# Patient Record
Sex: Male | Born: 1970
Health system: Southern US, Community
[De-identification: ages and names within clinical notes are randomized; demographics above are authoritative.]

## PROBLEM LIST (undated history)

## (undated) DIAGNOSIS — N419 Inflammatory disease of prostate, unspecified: Secondary | ICD-10-CM

## (undated) DIAGNOSIS — E119 Type 2 diabetes mellitus without complications: Secondary | ICD-10-CM

## (undated) DIAGNOSIS — I10 Essential (primary) hypertension: Secondary | ICD-10-CM

## (undated) DIAGNOSIS — G4730 Sleep apnea, unspecified: Secondary | ICD-10-CM

## (undated) HISTORY — PX: COLONOSCOPY: SHX174

## (undated) HISTORY — DX: Essential (primary) hypertension: I10

## (undated) HISTORY — DX: Sleep apnea, unspecified: G47.30

## (undated) HISTORY — DX: Inflammatory disease of prostate, unspecified: N41.9

---

## 2001-12-15 LAB — HM COLONOSCOPY: HM Colonoscopy: NORMAL

## 2005-10-08 ENCOUNTER — Other Ambulatory Visit: Payer: Self-pay

## 2005-10-08 ENCOUNTER — Emergency Department: Payer: Self-pay | Admitting: Emergency Medicine

## 2007-06-09 ENCOUNTER — Other Ambulatory Visit: Payer: Self-pay

## 2007-06-09 ENCOUNTER — Emergency Department: Payer: Self-pay | Admitting: Emergency Medicine

## 2007-06-23 ENCOUNTER — Ambulatory Visit: Payer: Self-pay | Admitting: Internal Medicine

## 2008-07-18 ENCOUNTER — Emergency Department: Payer: Self-pay | Admitting: Emergency Medicine

## 2008-07-18 ENCOUNTER — Other Ambulatory Visit: Payer: Self-pay

## 2009-01-09 ENCOUNTER — Ambulatory Visit: Payer: Self-pay | Admitting: Internal Medicine

## 2010-07-03 ENCOUNTER — Emergency Department: Payer: Self-pay | Admitting: Internal Medicine

## 2010-07-14 ENCOUNTER — Emergency Department: Payer: Self-pay | Admitting: Emergency Medicine

## 2010-10-04 ENCOUNTER — Ambulatory Visit: Payer: Self-pay

## 2012-12-15 ENCOUNTER — Encounter: Payer: Self-pay | Admitting: Internal Medicine

## 2012-12-15 ENCOUNTER — Ambulatory Visit (INDEPENDENT_AMBULATORY_CARE_PROVIDER_SITE_OTHER): Payer: 59 | Admitting: Internal Medicine

## 2012-12-15 VITALS — BP 140/88 | HR 80 | Temp 98.2°F | Ht 74.0 in | Wt 325.0 lb

## 2012-12-15 DIAGNOSIS — Z8249 Family history of ischemic heart disease and other diseases of the circulatory system: Secondary | ICD-10-CM | POA: Insufficient documentation

## 2012-12-15 DIAGNOSIS — N529 Male erectile dysfunction, unspecified: Secondary | ICD-10-CM

## 2012-12-15 DIAGNOSIS — E785 Hyperlipidemia, unspecified: Secondary | ICD-10-CM

## 2012-12-15 DIAGNOSIS — Z823 Family history of stroke: Secondary | ICD-10-CM

## 2012-12-15 DIAGNOSIS — I10 Essential (primary) hypertension: Secondary | ICD-10-CM

## 2012-12-15 DIAGNOSIS — D649 Anemia, unspecified: Secondary | ICD-10-CM

## 2012-12-15 LAB — COMPREHENSIVE METABOLIC PANEL
ALT: 51 U/L (ref 0–53)
AST: 34 U/L (ref 0–37)
Albumin: 3.9 g/dL (ref 3.5–5.2)
Alkaline Phosphatase: 58 U/L (ref 39–117)
BUN: 16 mg/dL (ref 6–23)
CO2: 25 mEq/L (ref 19–32)
Calcium: 9.4 mg/dL (ref 8.4–10.5)
Chloride: 104 mEq/L (ref 96–112)
Creatinine, Ser: 1.2 mg/dL (ref 0.4–1.5)
GFR: 88.72 mL/min (ref >60.00)
Glucose, Bld: 99 mg/dL (ref 70–99)
Potassium: 4 mEq/L (ref 3.5–5.1)
Sodium: 140 mEq/L (ref 135–145)
Total Bilirubin: 0.7 mg/dL (ref 0.3–1.2)
Total Protein: 7.5 g/dL (ref 6.0–8.3)

## 2012-12-15 LAB — CBC WITH DIFFERENTIAL/PLATELET
Basophils Absolute: 0 10*3/uL (ref 0.0–0.1)
Basophils Relative: 0.6 % (ref 0.0–3.0)
Eosinophils Absolute: 0.2 10*3/uL (ref 0.0–0.7)
Eosinophils Relative: 2.5 % (ref 0.0–5.0)
HCT: 45.9 % (ref 39.0–52.0)
Hemoglobin: 15.5 g/dL (ref 13.0–17.0)
Lymphocytes Relative: 33.2 % (ref 12.0–46.0)
Lymphs Abs: 2.7 10*3/uL (ref 0.7–4.0)
MCHC: 33.8 g/dL (ref 30.0–36.0)
MCV: 90.6 fl (ref 78.0–100.0)
Monocytes Absolute: 0.8 10*3/uL (ref 0.1–1.0)
Monocytes Relative: 9.9 % (ref 3.0–12.0)
Neutro Abs: 4.4 10*3/uL (ref 1.4–7.7)
Neutrophils Relative %: 53.8 % (ref 43.0–77.0)
Platelets: 273 10*3/uL (ref 150.0–400.0)
RBC: 5.07 Mil/uL (ref 4.22–5.81)
RDW: 13.3 % (ref 11.5–14.6)
WBC: 8.2 10*3/uL (ref 4.5–10.5)

## 2012-12-15 LAB — LIPID PANEL
Cholesterol: 153 mg/dL (ref 0–200)
HDL: 33.4 mg/dL — ABNORMAL LOW (ref >39.00)
LDL Cholesterol: 91 mg/dL (ref 0–99)
Total CHOL/HDL Ratio: 5
Triglycerides: 144 mg/dL (ref 0.0–149.0)
VLDL: 28.8 mg/dL (ref 0.0–40.0)

## 2012-12-15 LAB — MICROALBUMIN / CREATININE URINE RATIO
Creatinine,U: 234.8 mg/dL
Microalb Creat Ratio: 0.9 mg/g (ref 0.0–30.0)
Microalb, Ur: 2.1 mg/dL — ABNORMAL HIGH (ref 0.0–1.9)

## 2012-12-15 MED ORDER — DILTIAZEM HCL ER COATED BEADS 180 MG PO CP24: 180.0000 mg | ORAL_CAPSULE | Freq: Every day | ORAL | Status: DC

## 2012-12-15 MED ORDER — TADALAFIL 5 MG PO TABS: 5.0000 mg | ORAL_TABLET | Freq: Every day | ORAL | Status: DC | PRN

## 2012-12-15 MED ORDER — LISINOPRIL 30 MG PO TABS: 30.0000 mg | ORAL_TABLET | Freq: Every day | ORAL | Status: DC

## 2012-12-15 NOTE — Assessment & Plan Note (Signed)
Occasional symptoms of ED. Will try using Cialis to see if any improvement.

## 2012-12-15 NOTE — Assessment & Plan Note (Signed)
Mother had cerebral aneurysm. Will try to get additional records on this.

## 2012-12-15 NOTE — Assessment & Plan Note (Addendum)
BP elevated today and pt reports it has been consistently >140/90. Will increase dose of Lisinopril to 30mg  daily. Will continue Diltiazem. Will request previous records on management. Will check renal function and urine microalbumin today. Follow up 1 month for recheck.

## 2012-12-15 NOTE — Progress Notes (Signed)
Subjective:    Patient ID: Eric Stevens, male    DOB: [birth year], 42 y.o.   MRN: [id number]  HPI 42 year old male with history of hypertension presents to establish care. He reports he is generally feeling well. He has been on current blood pressure medications for several years. He denies any chest pain, palpitations, headache. He reports that his blood pressure typically runs greater than 140/90. He notes a family history of cerebral aneurysm in his mother. He does not have further details on this.  His only concern today is intermittent episodes of difficulty with erectile function. He has never taken medication for this. He denies any decrease in libido. He notes he was evaluated by urologist in the past for prostatitis and evaluation was normal. During that episode, he was treated with antibiotics with resolution.  Outpatient Encounter Prescriptions as of 12/15/2012  Medication Sig Dispense Refill  . diltiazem (CARDIZEM CD) 180 MG 24 hr capsule Take 1 capsule (180 mg total) by mouth daily.  90 capsule  4  . lisinopril (PRINIVIL,ZESTRIL) 30 MG tablet Take 1 tablet (30 mg total) by mouth daily.  90 tablet  4  . Multiple Vitamin (MULTIVITAMIN) tablet Take 1 tablet by mouth daily.      . tadalafil (CIALIS) 5 MG tablet Take 1 tablet (5 mg total) by mouth daily as needed for erectile dysfunction.  10 tablet  0   BP 140/88  Pulse 80  Temp 98.2 F (36.8 C) (Oral)  Ht 6\' 2"  (1.88 m)  Wt 325 lb (147.419 kg)  BMI 41.73 kg/m2  SpO2 98%  Review of Systems  Constitutional: Negative for fever, chills, activity change, appetite change, fatigue and unexpected weight change.  Eyes: Negative for visual disturbance.  Respiratory: Negative for cough and shortness of breath.   Cardiovascular: Negative for chest pain, palpitations and leg swelling.  Gastrointestinal: Negative for abdominal pain and abdominal distention.  Genitourinary: Negative for dysuria, urgency and difficulty urinating.    Musculoskeletal: Negative for arthralgias and gait problem.  Skin: Negative for color change and rash.  Hematological: Negative for adenopathy.  Psychiatric/Behavioral: Negative for sleep disturbance and dysphoric mood. The patient is not nervous/anxious.        Objective:   Physical Exam  Constitutional: He is oriented to person, place, and time. He appears well-developed and well-nourished. No distress.  HENT:  Head: Normocephalic and atraumatic.  Right Ear: External ear normal.  Left Ear: External ear normal.  Nose: Nose normal.  Mouth/Throat: Oropharynx is clear and moist. No oropharyngeal exudate.  Eyes: Conjunctivae normal and EOM are normal. Pupils are equal, round, and reactive to light. Right eye exhibits no discharge. Left eye exhibits no discharge. No scleral icterus.  Neck: Normal range of motion. Neck supple. No tracheal deviation present. No thyromegaly present.  Cardiovascular: Normal rate, regular rhythm and normal heart sounds.  Exam reveals no gallop and no friction rub.   No murmur heard. Pulmonary/Chest: Effort normal and breath sounds normal. No respiratory distress. He has no wheezes. He has no rales. He exhibits no tenderness.  Abdominal: Soft. Bowel sounds are normal. He exhibits no distension. There is no tenderness. There is no rebound and no guarding.  Musculoskeletal: Normal range of motion. He exhibits no edema.  Lymphadenopathy:    He has no cervical adenopathy.  Neurological: He is alert and oriented to person, place, and time. No cranial nerve deficit. Coordination normal.  Skin: Skin is warm and dry. No rash noted. He is not diaphoretic. No erythema. No  pallor.  Psychiatric: He has a normal mood and affect. His behavior is normal. Judgment and thought content normal.          Assessment & Plan:

## 2013-01-14 ENCOUNTER — Ambulatory Visit: Payer: 59 | Admitting: Internal Medicine

## 2013-01-19 ENCOUNTER — Ambulatory Visit: Payer: 59 | Admitting: Internal Medicine

## 2013-02-05 ENCOUNTER — Encounter: Payer: Self-pay | Admitting: Internal Medicine

## 2013-02-05 ENCOUNTER — Ambulatory Visit (INDEPENDENT_AMBULATORY_CARE_PROVIDER_SITE_OTHER): Payer: 59 | Admitting: Internal Medicine

## 2013-02-05 VITALS — BP 148/98 | HR 98 | Temp 98.1°F | Ht 75.0 in | Wt 326.0 lb

## 2013-02-05 DIAGNOSIS — I10 Essential (primary) hypertension: Secondary | ICD-10-CM

## 2013-02-05 DIAGNOSIS — G4733 Obstructive sleep apnea (adult) (pediatric): Secondary | ICD-10-CM | POA: Insufficient documentation

## 2013-02-05 MED ORDER — DILTIAZEM HCL ER COATED BEADS 180 MG PO CP24: 180.0000 mg | ORAL_CAPSULE | Freq: Every day | ORAL | Status: DC

## 2013-02-05 MED ORDER — LISINOPRIL 30 MG PO TABS: 30.0000 mg | ORAL_TABLET | Freq: Every day | ORAL | Status: DC

## 2013-02-05 NOTE — Assessment & Plan Note (Signed)
Patient reports history of obstructive sleep apnea. Will try to get records on previous sleep study. Will set up evaluation with sleep specialist to start CPAP.

## 2013-02-05 NOTE — Progress Notes (Signed)
Subjective:    Patient ID: Eric Stevens, male    DOB: 02/03/1971, 42 y.o.   MRN: [id number]  HPI 42 year old male with history of sleep apnea, hypertension presents for followup. At his last visit, he was instructed to increase lisinopril to 30 mg daily. Unfortunately he never received this prescription from his mail-order pharmacy. He continues on 20 mg daily. He denies any chest pain, palpitations, headache. He reports blood pressure is generally 150s over 90s. Next  He also notes a history of obstructive sleep apnea. Last sleep study approximately 2 years ago. He has not been wearing CPAP over the last couple of years. He notes some daytime fatigue. He would like to reestablish CPAP usage.  Outpatient Encounter Prescriptions as of 02/05/2013  Medication Sig Dispense Refill  . diltiazem (CARDIZEM CD) 180 MG 24 hr capsule Take 1 capsule (180 mg total) by mouth daily.  30 capsule  4  . lisinopril (PRINIVIL,ZESTRIL) 30 MG tablet Take 1 tablet (30 mg total) by mouth daily.  30 tablet  3  . Multiple Vitamin (MULTIVITAMIN) tablet Take 1 tablet by mouth daily.      . [DISCONTINUED] diltiazem (CARDIZEM CD) 180 MG 24 hr capsule Take 1 capsule (180 mg total) by mouth daily.  90 capsule  4  . [DISCONTINUED] lisinopril (PRINIVIL,ZESTRIL) 30 MG tablet Take 1 tablet (30 mg total) by mouth daily.  90 tablet  4  . tadalafil (CIALIS) 5 MG tablet Take 1 tablet (5 mg total) by mouth daily as needed for erectile dysfunction.  10 tablet  0   No facility-administered encounter medications on file as of 02/05/2013.   BP 148/98  Pulse 98  Temp(Src) 98.1 F (36.7 C) (Oral)  Ht 6\' 3"  (1.905 m)  Wt 326 lb (147.873 kg)  BMI 40.75 kg/m2  SpO2 97%  Review of Systems  Constitutional: Positive for fatigue. Negative for fever, chills, activity change, appetite change and unexpected weight change.  Eyes: Negative for visual disturbance.  Respiratory: Negative for cough and shortness of breath.   Cardiovascular:  Negative for chest pain, palpitations and leg swelling.  Gastrointestinal: Negative for abdominal pain and abdominal distention.  Genitourinary: Negative for dysuria, urgency and difficulty urinating.  Musculoskeletal: Negative for arthralgias and gait problem.  Skin: Negative for color change and rash.  Hematological: Negative for adenopathy.  Psychiatric/Behavioral: Negative for sleep disturbance and dysphoric mood. The patient is not nervous/anxious.        Objective:   Physical Exam  Constitutional: He is oriented to person, place, and time. He appears well-developed and well-nourished. No distress.  HENT:  Head: Normocephalic and atraumatic.  Right Ear: External ear normal.  Left Ear: External ear normal.  Nose: Nose normal.  Mouth/Throat: Oropharynx is clear and moist. No oropharyngeal exudate.  Eyes: Conjunctivae and EOM are normal. Pupils are equal, round, and reactive to light. Right eye exhibits no discharge. Left eye exhibits no discharge. No scleral icterus.  Neck: Normal range of motion. Neck supple. No tracheal deviation present. No thyromegaly present.  Cardiovascular: Normal rate, regular rhythm and normal heart sounds.  Exam reveals no gallop and no friction rub.   No murmur heard. Pulmonary/Chest: Effort normal and breath sounds normal. No respiratory distress. He has no wheezes. He has no rales. He exhibits no tenderness.  Musculoskeletal: Normal range of motion. He exhibits no edema.  Lymphadenopathy:    He has no cervical adenopathy.  Neurological: He is alert and oriented to person, place, and time. No cranial nerve deficit. Coordination  normal.  Skin: Skin is warm and dry. No rash noted. He is not diaphoretic. No erythema. No pallor.  Psychiatric: He has a normal mood and affect. His behavior is normal. Judgment and thought content normal.          Assessment & Plan:

## 2013-02-05 NOTE — Assessment & Plan Note (Signed)
BP Readings from Last 3 Encounters:  02/05/13 148/98  12/15/12 140/88   BP persistently elevated, however pt has not yet received increased dose of Lisinopril from his mail order pharmacy. Will call in supply to local pharmacy. Follow up in 1 month for BP recheck.

## 2013-02-10 ENCOUNTER — Telehealth: Payer: Self-pay | Admitting: Internal Medicine

## 2013-02-10 NOTE — Telephone Encounter (Signed)
Sure. We can put him in next slot

## 2013-02-10 NOTE — Telephone Encounter (Signed)
Fwd to Dr. Walker 

## 2013-02-10 NOTE — Telephone Encounter (Signed)
Pt called to schedule DOT physical.  States he cannot go back to work until this is complete.  Pt on sched 6/4 for CPE.  Please advise if pt can be seen sooner for CPE due to his work situation.

## 2013-02-11 ENCOUNTER — Telehealth: Payer: Self-pay | Admitting: Internal Medicine

## 2013-02-11 NOTE — Telephone Encounter (Signed)
The first available is in May or end of April, he is requesting soon as possible.

## 2013-02-11 NOTE — Telephone Encounter (Signed)
Eric Stevens, I spoke with patient and he has confirmed next Tuesday 4/1 @ 1130, could you please add for me?

## 2013-02-11 NOTE — Telephone Encounter (Signed)
Was able to move pt to 4/22; no sooner 30 minute slots available.  Pt still needing the appt sooner.  Is it possible to add him on sooner?

## 2013-02-11 NOTE — Telephone Encounter (Signed)
Patient has been scheduled

## 2013-02-11 NOTE — Telephone Encounter (Signed)
We can add him at 11:30am next Tuesday as an overbook

## 2013-02-15 ENCOUNTER — Ambulatory Visit (INDEPENDENT_AMBULATORY_CARE_PROVIDER_SITE_OTHER): Payer: 59 | Admitting: Internal Medicine

## 2013-02-15 ENCOUNTER — Telehealth: Payer: Self-pay | Admitting: *Deleted

## 2013-02-15 ENCOUNTER — Encounter: Payer: Self-pay | Admitting: Internal Medicine

## 2013-02-15 VITALS — BP 138/100 | HR 91 | Temp 98.4°F | Wt 326.0 lb

## 2013-02-15 DIAGNOSIS — J302 Other seasonal allergic rhinitis: Secondary | ICD-10-CM | POA: Insufficient documentation

## 2013-02-15 DIAGNOSIS — G4733 Obstructive sleep apnea (adult) (pediatric): Secondary | ICD-10-CM

## 2013-02-15 DIAGNOSIS — I10 Essential (primary) hypertension: Secondary | ICD-10-CM

## 2013-02-15 DIAGNOSIS — J309 Allergic rhinitis, unspecified: Secondary | ICD-10-CM

## 2013-02-15 MED ORDER — LOSARTAN POTASSIUM-HCTZ 50-12.5 MG PO TABS: 1.0000 | ORAL_TABLET | Freq: Every day | ORAL | Status: DC

## 2013-02-15 MED ORDER — HYDROCHLOROTHIAZIDE 25 MG PO TABS: 25.0000 mg | ORAL_TABLET | Freq: Every day | ORAL | Status: DC

## 2013-02-15 MED ORDER — AZELASTINE-FLUTICASONE 137-50 MCG/ACT NA SUSP: 1.0000 | Freq: Two times a day (BID) | NASAL | Status: DC

## 2013-02-15 NOTE — Assessment & Plan Note (Signed)
BMI elevated>30, with hypertension, so some risk of OSA. However, no other symptoms such as daytime somnolence. Pt had sleep study in the past, but unsure of results. Will plan for repeat sleep study to evaluate for sleep apnea next week. Follow up 2 weeks.

## 2013-02-15 NOTE — Assessment & Plan Note (Signed)
Rhinorrhea and sneezing consistent with allergic rhinitis. Will start Claritin prn and also start Dymista bid. Follow up 2 weeks.

## 2013-02-15 NOTE — Progress Notes (Signed)
Subjective:    Patient ID: Eric Stevens, male    DOB: December 25, 1970, 42 y.o.   MRN: [id number]  HPI 42YO male with h/o HTN, obesity presents for follow up. He recently increased dose of lisinopril to 30mg  daily. Since that time, he notes that BP at home have been typically 130/80s. He denies chest pain, dyspnea, but has been having irritating dry cough. He also notes recent increase in clear nasal drainage, sneezing.  He does not have a history of seasonal allergies. He has not been taking any medication for this. He denies fever, chills, sick contacts.  He reports having sleep study in the past to evaluate for sleep apnea, but is unsure the results of this study. He has never been on CPAP. He denies daytime somnolence. He is scheduled to have a sleep study next week for evaluation of OSA. He has been trying to lose weight and has been exercising at a local gym.  Outpatient Encounter Prescriptions as of 02/15/2013  Medication Sig Dispense Refill  . diltiazem (CARDIZEM CD) 180 MG 24 hr capsule Take 1 capsule (180 mg total) by mouth daily.  30 capsule  4  . Multiple Vitamin (MULTIVITAMIN) tablet Take 1 tablet by mouth daily.      . [DISCONTINUED] lisinopril (PRINIVIL,ZESTRIL) 30 MG tablet Take 1 tablet (30 mg total) by mouth daily.  30 tablet  3  . Azelastine-Fluticasone (DYMISTA) 137-50 MCG/ACT SUSP Place 1 spray into the nose 2 (two) times daily.  23 g  3  . losartan-hydrochlorothiazide (HYZAAR) 50-12.5 MG per tablet Take 1 tablet by mouth daily.  90 tablet  3  . tadalafil (CIALIS) 5 MG tablet Take 1 tablet (5 mg total) by mouth daily as needed for erectile dysfunction.  10 tablet  0  . [DISCONTINUED] hydrochlorothiazide (HYDRODIURIL) 25 MG tablet Take 1 tablet (25 mg total) by mouth daily.  90 tablet  3   No facility-administered encounter medications on file as of 02/15/2013.   BP 138/100  Pulse 91  Temp(Src) 98.4 F (36.9 C) (Oral)  Wt 326 lb (147.873 kg)  BMI 40.75 kg/m2  SpO2  98%  Review of Systems  Constitutional: Negative for fever, chills, activity change, appetite change, fatigue and unexpected weight change.  HENT: Positive for congestion, rhinorrhea, sneezing and postnasal drip. Negative for ear pain, sore throat, trouble swallowing, voice change and sinus pressure.   Eyes: Negative for visual disturbance.  Respiratory: Positive for cough. Negative for shortness of breath.   Cardiovascular: Negative for chest pain, palpitations and leg swelling.  Gastrointestinal: Negative for abdominal pain and abdominal distention.  Genitourinary: Negative for dysuria, urgency and difficulty urinating.  Musculoskeletal: Negative for arthralgias and gait problem.  Skin: Negative for color change and rash.  Hematological: Negative for adenopathy.  Psychiatric/Behavioral: Negative for sleep disturbance and dysphoric mood. The patient is not nervous/anxious.        Objective:   Physical Exam  Constitutional: He is oriented to person, place, and time. He appears well-developed and well-nourished. No distress.  HENT:  Head: Normocephalic and atraumatic.  Right Ear: External ear normal.  Left Ear: External ear normal.  Nose: Mucosal edema and rhinorrhea present.  Mouth/Throat: Oropharynx is clear and moist. No oropharyngeal exudate.  Eyes: Conjunctivae and EOM are normal. Pupils are equal, round, and reactive to light. Right eye exhibits no discharge. Left eye exhibits no discharge. No scleral icterus.  Neck: Normal range of motion. Neck supple. No tracheal deviation present. No thyromegaly present.  Cardiovascular: Normal rate, regular  rhythm and normal heart sounds.  Exam reveals no gallop and no friction rub.   No murmur heard. Pulmonary/Chest: Effort normal and breath sounds normal. No accessory muscle usage. Not tachypneic. No respiratory distress. He has no decreased breath sounds. He has no wheezes. He has no rhonchi. He has no rales. He exhibits no tenderness.   Musculoskeletal: Normal range of motion. He exhibits no edema.  Lymphadenopathy:    He has no cervical adenopathy.  Neurological: He is alert and oriented to person, place, and time. No cranial nerve deficit. Coordination normal.  Skin: Skin is warm and dry. No rash noted. He is not diaphoretic. No erythema. No pallor.  Psychiatric: He has a normal mood and affect. His behavior is normal. Judgment and thought content normal.          Assessment & Plan:

## 2013-02-15 NOTE — Patient Instructions (Signed)
Stop Lisinopril.  Start Losartan-HCTZ daily for blood pressure. Continue Diltiazem.  Follow up 2 weeks.

## 2013-02-15 NOTE — Telephone Encounter (Signed)
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 Patient confirmed appointment for today, rescheduled from 4/1 to today at 3:00

## 2013-02-15 NOTE — Assessment & Plan Note (Addendum)
BP Readings from Last 3 Encounters:  02/15/13 138/100  02/05/13 148/98  12/15/12 140/88   BP continues to be elevated. Pt now having dry cough, question if this may be allergy to lisinopril. Will change to Losartan-HCTZ. Will recheck BP in 2 weeks. Will plan to check BMP with labs then. If no improvement in BP at next visit, discussed checking renal US.

## 2013-02-16 ENCOUNTER — Telehealth: Payer: Self-pay | Admitting: *Deleted

## 2013-02-16 ENCOUNTER — Encounter: Payer: 59 | Admitting: Internal Medicine

## 2013-02-16 ENCOUNTER — Ambulatory Visit: Payer: 59 | Admitting: Internal Medicine

## 2013-02-16 NOTE — Telephone Encounter (Signed)
Spoke with patient, he did not remember which medication he was to d/c. Looked over notes, informed him Dr. Dan Humphreys wanted him to stop taking the Lisinopril.

## 2013-02-16 NOTE — Telephone Encounter (Signed)
Information faxed via [employer] 

## 2013-02-16 NOTE — Telephone Encounter (Addendum)
Patient called because he does not remember what medication he is supposed to start and which one he is supposed to stop taking when he was last seen by Dr. Dan Humphreys

## 2013-02-16 NOTE — Telephone Encounter (Signed)
Patient called regarding visit with Dr. Dan Humphreys yesterday and would like the notes faxed to this number [phone]

## 2013-02-18 ENCOUNTER — Ambulatory Visit: Payer: 59 | Admitting: Internal Medicine

## 2013-02-23 ENCOUNTER — Other Ambulatory Visit: Payer: Self-pay | Admitting: *Deleted

## 2013-02-23 ENCOUNTER — Telehealth: Payer: Self-pay | Admitting: Internal Medicine

## 2013-02-23 NOTE — Telephone Encounter (Signed)
Please let pt know that it can take 1-2 weeks for [place] to get results on sleep study. I will call or email him as soon as they are available.

## 2013-02-23 NOTE — Telephone Encounter (Signed)
Caller: Eric Stevens/Patient; Phone: [phone]; Reason for Call: Patient did a sleep study last night and was wanting to know what Dr.  Dan Humphreys thought of the results.  Reviewed chart, but did not see results of sleep study at this time.  Please contact patient when results have been reviewed with further instructions.  Thanks.

## 2013-02-24 NOTE — Telephone Encounter (Signed)
Patient notified as instructed by telephone. 

## 2013-03-03 ENCOUNTER — Telehealth: Payer: Self-pay | Admitting: Internal Medicine

## 2013-03-03 NOTE — Telephone Encounter (Signed)
Pt employer calling stating that the pt had a sleep study done and has to have it read by his PCP to clear him to return to work.

## 2013-03-03 NOTE — Telephone Encounter (Signed)
Talked to pt employer that we had not received the notes and the pt employer said he would fax over the notes. Called pt employer back to confirm that we have recieved the notes, and Dr. Dan Humphreys said that pt can go back to work.

## 2013-03-09 ENCOUNTER — Encounter: Payer: 59 | Admitting: Internal Medicine

## 2013-03-10 ENCOUNTER — Ambulatory Visit (INDEPENDENT_AMBULATORY_CARE_PROVIDER_SITE_OTHER): Payer: 59 | Admitting: Internal Medicine

## 2013-03-10 ENCOUNTER — Encounter: Payer: Self-pay | Admitting: Internal Medicine

## 2013-03-10 VITALS — BP 144/100 | HR 79 | Temp 98.2°F | Wt 330.0 lb

## 2013-03-10 DIAGNOSIS — I10 Essential (primary) hypertension: Secondary | ICD-10-CM

## 2013-03-10 DIAGNOSIS — J320 Chronic maxillary sinusitis: Secondary | ICD-10-CM

## 2013-03-10 MED ORDER — AMOXICILLIN-POT CLAVULANATE 875-125 MG PO TABS: 1.0000 | ORAL_TABLET | Freq: Two times a day (BID) | ORAL | Status: DC

## 2013-03-10 MED ORDER — LOSARTAN POTASSIUM-HCTZ 100-12.5 MG PO TABS: 1.0000 | ORAL_TABLET | Freq: Every day | ORAL | Status: DC

## 2013-03-10 MED ORDER — DILTIAZEM HCL ER COATED BEADS 180 MG PO CP24: 180.0000 mg | ORAL_CAPSULE | Freq: Every day | ORAL | Status: DC

## 2013-03-10 NOTE — Progress Notes (Signed)
Subjective:    Patient ID: Eric Stevens, male    DOB: [birth year], 42 y.o.   MRN: [id number]  HPI 42 year old male presents for followup of elevated blood pressure. He reports he has been compliant with his medication however, blood pressure continues to be typically greater than 150/100. He denies any headache, chest pain, palpitations. He reports the cough resolved after switching from lisinopril to losartan.  He underwent sleep study in the interim since his last visit which was negative for sleep apnea.  He is also concerned about ongoing left-sided nasal congestion. This has been present for several weeks. He describes pain and nasal congestion in his left cheek. He has tried taking over-the-counter antihistamines and using nasal steroid with no improvement. He denies any history of trauma to his face or sinus surgery. He denies any fever, chills, cough, shortness of breath.  Outpatient Encounter Prescriptions as of 03/10/2013  Medication Sig Dispense Refill  . Azelastine-Fluticasone (DYMISTA) 137-50 MCG/ACT SUSP Place 1 spray into the nose 2 (two) times daily.  23 g  3  . diltiazem (CARDIZEM CD) 180 MG 24 hr capsule Take 1 capsule (180 mg total) by mouth daily.  30 capsule  11  . Multiple Vitamin (MULTIVITAMIN) tablet Take 1 tablet by mouth daily.      Marland Kitchen amoxicillin-clavulanate (AUGMENTIN) 875-125 MG per tablet Take 1 tablet by mouth 2 (two) times daily.  20 tablet  0  . losartan-hydrochlorothiazide (HYZAAR) 100-12.5 MG per tablet Take 1 tablet by mouth daily.  30 tablet  11  . tadalafil (CIALIS) 5 MG tablet Take 1 tablet (5 mg total) by mouth daily as needed for erectile dysfunction.  10 tablet  0   No facility-administered encounter medications on file as of 03/10/2013.   BP 144/100  Pulse 79  Temp(Src) 98.2 F (36.8 C) (Oral)  Wt 330 lb (149.687 kg)  BMI 41.25 kg/m2  SpO2 97%  Review of Systems  Constitutional: Negative for fever, chills, activity change, appetite change,  fatigue and unexpected weight change.  HENT: Positive for congestion and sinus pressure. Negative for rhinorrhea, sneezing, neck pain and postnasal drip.   Eyes: Negative for visual disturbance.  Respiratory: Negative for cough and shortness of breath.   Cardiovascular: Negative for chest pain, palpitations and leg swelling.  Gastrointestinal: Negative for abdominal pain and abdominal distention.  Genitourinary: Negative for dysuria, urgency and difficulty urinating.  Musculoskeletal: Negative for arthralgias and gait problem.  Skin: Negative for color change and rash.  Hematological: Negative for adenopathy.  Psychiatric/Behavioral: Negative for sleep disturbance and dysphoric mood. The patient is not nervous/anxious.        Objective:   Physical Exam  Constitutional: He is oriented to person, place, and time. He appears well-developed and well-nourished. No distress.  HENT:  Head: Normocephalic and atraumatic.  Right Ear: External ear normal.  Left Ear: External ear normal.  Nose: Mucosal edema present. Right sinus exhibits no maxillary sinus tenderness. Left sinus exhibits maxillary sinus tenderness.  Mouth/Throat: Oropharynx is clear and moist. No oropharyngeal exudate.  Eyes: Conjunctivae and EOM are normal. Pupils are equal, round, and reactive to light. Right eye exhibits no discharge. Left eye exhibits no discharge. No scleral icterus.  Neck: Normal range of motion. Neck supple. No tracheal deviation present. No thyromegaly present.  Cardiovascular: Normal rate, regular rhythm and normal heart sounds.  Exam reveals no gallop and no friction rub.   No murmur heard. Pulmonary/Chest: Effort normal and breath sounds normal. No accessory muscle usage. Not tachypneic. No  respiratory distress. He has no decreased breath sounds. He has no wheezes. He has no rhonchi. He has no rales. He exhibits no tenderness.  Musculoskeletal: Normal range of motion. He exhibits no edema.   Lymphadenopathy:    He has no cervical adenopathy.  Neurological: He is alert and oriented to person, place, and time. No cranial nerve deficit. Coordination normal.  Skin: Skin is warm and dry. No rash noted. He is not diaphoretic. No erythema. No pallor.  Psychiatric: He has a normal mood and affect. His behavior is normal. Judgment and thought content normal.          Assessment & Plan:

## 2013-03-10 NOTE — Assessment & Plan Note (Signed)
Symptoms are most consistent with left maxillary sinusitis. Will treat with Augmentin x10 days. If no improvement, we discussed referral to ENT for direct visualization of the left maxillary sinus.

## 2013-03-10 NOTE — Assessment & Plan Note (Signed)
BP Readings from Last 3 Encounters:  03/10/13 144/100  02/15/13 138/100  02/05/13 148/98   BP elevated despite increase in medication. Will set up renal artery doppler for further evaluation. Will increase Losartan-HCTZ to 100-12.5. Follow up 4 weeks.

## 2013-03-10 NOTE — Patient Instructions (Signed)
Start higher dose of Losartan-HCTZ 100-12.5mg  daily. Monitor blood presure 1-2 times per week at home.  Email with update Monday.  We will schedule ultrasound of your kidneys.  Follow up 4 weeks.

## 2013-03-19 ENCOUNTER — Ambulatory Visit: Payer: 59 | Admitting: Internal Medicine

## 2013-03-19 ENCOUNTER — Encounter: Payer: Self-pay | Admitting: Internal Medicine

## 2013-03-25 ENCOUNTER — Telehealth: Payer: Self-pay | Admitting: Internal Medicine

## 2013-03-25 NOTE — Telephone Encounter (Signed)
Renal artery ultrasound showed normal left renal artery, however they could not visualize the proximal area. Right renal artery could not be visualized because of bowel gas.

## 2013-03-26 NOTE — Telephone Encounter (Signed)
LMTCB

## 2013-03-26 NOTE — Telephone Encounter (Signed)
Patient informed and verbally agreed.  

## 2013-04-07 ENCOUNTER — Telehealth: Payer: Self-pay | Admitting: *Deleted

## 2013-04-07 NOTE — Telephone Encounter (Signed)
Spoke with patient he stated he was given some medication for his sinus and it helped. But now it is back, his nose is getting stopped at night again. Would like to know if something could be called in again.

## 2013-04-07 NOTE — Telephone Encounter (Signed)
Patient left voicemail stating he was given abx a couple months ago and it did help with the sinus infection but now it seems like it has returned.

## 2013-04-07 NOTE — Telephone Encounter (Signed)
Spoke with patient, he has an appointment next week, this would be only time he would be able to get in.

## 2013-04-07 NOTE — Telephone Encounter (Signed)
Needs visit

## 2013-04-14 ENCOUNTER — Encounter: Payer: Self-pay | Admitting: Internal Medicine

## 2013-04-14 ENCOUNTER — Ambulatory Visit (INDEPENDENT_AMBULATORY_CARE_PROVIDER_SITE_OTHER): Payer: 59 | Admitting: Internal Medicine

## 2013-04-14 ENCOUNTER — Other Ambulatory Visit (INDEPENDENT_AMBULATORY_CARE_PROVIDER_SITE_OTHER): Payer: 59

## 2013-04-14 VITALS — BP 144/102 | HR 76 | Temp 98.6°F | Wt 328.0 lb

## 2013-04-14 DIAGNOSIS — R197 Diarrhea, unspecified: Secondary | ICD-10-CM

## 2013-04-14 DIAGNOSIS — I10 Essential (primary) hypertension: Secondary | ICD-10-CM

## 2013-04-14 MED ORDER — LOSARTAN POTASSIUM-HCTZ 100-12.5 MG PO TABS: 1.0000 | ORAL_TABLET | Freq: Every day | ORAL | Status: DC

## 2013-04-14 NOTE — Assessment & Plan Note (Signed)
BP Readings from Last 3 Encounters:  04/14/13 144/102  03/10/13 144/100  02/15/13 138/100   BP elevated today, however pt has been out of medication (because insurance requires from mail order). Rx sent to mail order today. Will recheck BP next week.

## 2013-04-14 NOTE — Assessment & Plan Note (Signed)
Symptoms of perfuse watery diarrhea over last 3 days. Differential includes viral gastroenteritis versus bacterial enteritis. Pt has some risk of CDiff given recent use of Augmentin. Will send stool culture and testing for CDiff and Ecoli. Pt will increase fluid intake and continue to use Immodium sparingly. Follow up 4 days and prn.

## 2013-04-14 NOTE — Progress Notes (Signed)
Subjective:    Patient ID: Eric Stevens, male    DOB: 1970/11/26, 42 y.o.   MRN: [id number]  HPI 42YO male with HTN presents for follow up. Concerned today about 3 days of watery diarrhea, abdominal cramping.  Had subjective fever and chills on Monday, this has resolved. Approx 5 stools per day, watery, non-bloody. Abdominal cramping prior to BMs. Symptoms improved slightly with Immodium. No sick contacts. No recent travel, camping. Recent exposure to Augmentin for sinus infection.  Has been out of BP meds x 3-4 days because insurance would not refill at local pharmacy. Needs mail order Rx.  Outpatient Encounter Prescriptions as of 04/14/2013  Medication Sig Dispense Refill  . diltiazem (CARDIZEM CD) 180 MG 24 hr capsule Take 1 capsule (180 mg total) by mouth daily.  30 capsule  11  . Loperamide HCl (IMODIUM PO) Take by mouth.      . losartan-hydrochlorothiazide (HYZAAR) 100-12.5 MG per tablet Take 1 tablet by mouth daily.  90 tablet  4  . [DISCONTINUED] losartan-hydrochlorothiazide (HYZAAR) 100-12.5 MG per tablet Take 1 tablet by mouth daily.  30 tablet  11  . Azelastine-Fluticasone (DYMISTA) 137-50 MCG/ACT SUSP Place 1 spray into the nose 2 (two) times daily.  23 g  3  . Multiple Vitamin (MULTIVITAMIN) tablet Take 1 tablet by mouth daily.      . tadalafil (CIALIS) 5 MG tablet Take 1 tablet (5 mg total) by mouth daily as needed for erectile dysfunction.  10 tablet  0  . [DISCONTINUED] amoxicillin-clavulanate (AUGMENTIN) 875-125 MG per tablet Take 1 tablet by mouth 2 (two) times daily.  20 tablet  0   No facility-administered encounter medications on file as of 04/14/2013.   BP 144/102  Pulse 76  Temp(Src) 98.6 F (37 C) (Oral)  Wt 328 lb (148.78 kg)  BMI 41 kg/m2  SpO2 97%  Review of Systems  Constitutional: Positive for fever and chills. Negative for activity change, appetite change, fatigue and unexpected weight change.  Eyes: Negative for visual disturbance.  Respiratory: Negative  for cough and shortness of breath.   Cardiovascular: Negative for chest pain, palpitations and leg swelling.  Gastrointestinal: Positive for abdominal pain (cramping) and diarrhea. Negative for nausea, vomiting, blood in stool, abdominal distention, anal bleeding and rectal pain.  Genitourinary: Negative for dysuria, urgency and difficulty urinating.  Musculoskeletal: Negative for arthralgias and gait problem.  Skin: Negative for color change and rash.  Hematological: Negative for adenopathy.  Psychiatric/Behavioral: Negative for sleep disturbance and dysphoric mood. The patient is not nervous/anxious.        Objective:   Physical Exam  Constitutional: He is oriented to person, place, and time. He appears well-developed and well-nourished. No distress.  HENT:  Head: Normocephalic and atraumatic.  Right Ear: External ear normal.  Left Ear: External ear normal.  Nose: Nose normal.  Mouth/Throat: Oropharynx is clear and moist. No oropharyngeal exudate.  Eyes: Conjunctivae and EOM are normal. Pupils are equal, round, and reactive to light. Right eye exhibits no discharge. Left eye exhibits no discharge. No scleral icterus.  Neck: Normal range of motion. Neck supple. No tracheal deviation present. No thyromegaly present.  Cardiovascular: Normal rate, regular rhythm and normal heart sounds.  Exam reveals no gallop and no friction rub.   No murmur heard. Pulmonary/Chest: Effort normal and breath sounds normal. No respiratory distress. He has no wheezes. He has no rales. He exhibits no tenderness.  Abdominal: Soft. Bowel sounds are normal. He exhibits no distension and no mass. There is  tenderness (diffuse mild). There is no rebound and no guarding.  Musculoskeletal: Normal range of motion. He exhibits no edema.  Lymphadenopathy:    He has no cervical adenopathy.  Neurological: He is alert and oriented to person, place, and time. No cranial nerve deficit. Coordination normal.  Skin: Skin is  warm and dry. No rash noted. He is not diaphoretic. No erythema. No pallor.  Psychiatric: He has a normal mood and affect. His behavior is normal. Judgment and thought content normal.          Assessment & Plan:

## 2013-04-15 LAB — GASTROINTESTINAL PATHOGEN PANEL PCR
C. difficile Tox A/B, PCR: NEGATIVE
Campylobacter, PCR: NEGATIVE
Cryptosporidium, PCR: NEGATIVE
E coli (ETEC) LT/ST PCR: NEGATIVE
E coli (STEC) stx1/stx2, PCR: NEGATIVE
E coli 0157, PCR: NEGATIVE
Giardia lamblia, PCR: NEGATIVE
Norovirus, PCR: NEGATIVE
Rotavirus A, PCR: NEGATIVE
Salmonella, PCR: NEGATIVE
Shigella, PCR: NEGATIVE

## 2013-04-15 LAB — CLOSTRIDIUM DIFFICILE EIA: CDIFTX: NEGATIVE

## 2013-04-15 LAB — OVA AND PARASITE SCREEN: OP: NONE SEEN

## 2013-04-16 ENCOUNTER — Encounter: Payer: Self-pay | Admitting: Internal Medicine

## 2013-04-16 ENCOUNTER — Telehealth: Payer: Self-pay | Admitting: *Deleted

## 2013-04-16 MED ORDER — DIPHENOXYLATE-ATROPINE 2.5-0.025 MG PO TABS: 1.0000 | ORAL_TABLET | Freq: Four times a day (QID) | ORAL | Status: DC | PRN

## 2013-04-16 NOTE — Telephone Encounter (Signed)
He has been according to [employer].

## 2013-04-16 NOTE — Telephone Encounter (Signed)
We will need to call it in. I have printed it. He will need to follow up next week.

## 2013-04-16 NOTE — Telephone Encounter (Signed)
Patient called back and stated he is still having symptoms, he did not eat much of anything yesterday but drank a lot of liquids. He is still having the symptoms though, would like to know if there is anything he can do? Please advise.

## 2013-04-16 NOTE — Telephone Encounter (Signed)
Spoke to patient and he has been taking it, would like it called in to [facility] on [address]

## 2013-04-16 NOTE — Telephone Encounter (Signed)
Message copied by Theola Sequin on Fri Apr 16, 2013 12:43 PM ------      Message from: Ronna Polio A      Created: Fri Apr 16, 2013  7:56 AM       Can you make sure pt received this            Stool testing was negative. Are your symptoms better? ------

## 2013-04-16 NOTE — Telephone Encounter (Signed)
Is he taking the Immodium? If yes, and symptoms are persistent, then we can call in Lomotil.

## 2013-04-18 LAB — STOOL CULTURE

## 2013-04-19 NOTE — Telephone Encounter (Signed)
Rx was called in on Friday of last week and patient is aware

## 2013-04-21 ENCOUNTER — Encounter: Payer: 59 | Admitting: Internal Medicine

## 2013-04-22 ENCOUNTER — Ambulatory Visit: Payer: 59 | Admitting: Internal Medicine

## 2013-04-28 ENCOUNTER — Ambulatory Visit: Payer: 59 | Admitting: Internal Medicine

## 2013-05-31 ENCOUNTER — Other Ambulatory Visit: Payer: Self-pay | Admitting: *Deleted

## 2013-05-31 DIAGNOSIS — I10 Essential (primary) hypertension: Secondary | ICD-10-CM

## 2013-05-31 MED ORDER — DILTIAZEM HCL ER COATED BEADS 180 MG PO CP24: 180.0000 mg | ORAL_CAPSULE | Freq: Every day | ORAL | Status: DC

## 2013-05-31 NOTE — Telephone Encounter (Signed)
Rx sent to pharmacy on file.

## 2013-06-28 ENCOUNTER — Encounter: Payer: Self-pay | Admitting: Adult Health

## 2013-06-28 ENCOUNTER — Ambulatory Visit (INDEPENDENT_AMBULATORY_CARE_PROVIDER_SITE_OTHER): Payer: 59 | Admitting: Adult Health

## 2013-06-28 VITALS — BP 132/84 | HR 72 | Temp 98.2°F | Resp 12 | Wt 331.5 lb

## 2013-06-28 DIAGNOSIS — R21 Rash and other nonspecific skin eruption: Secondary | ICD-10-CM | POA: Insufficient documentation

## 2013-06-28 MED ORDER — TRIAMCINOLONE ACETONIDE 0.1 % EX CREA: TOPICAL_CREAM | Freq: Two times a day (BID) | CUTANEOUS | Status: DC

## 2013-06-28 NOTE — Progress Notes (Signed)
  Subjective:    Patient ID: Eric Stevens, male    DOB: 07/14/1971, 42 y.o.   MRN: [id number]  HPI  Patient presents with rash on forearms that has been ongoing for approximately 1 week. He reports that his son had a rash similar prior to his rash developing. Patient has been scratching which is causing rash to spread. Denies any other symptoms associated with rash.  Current Outpatient Prescriptions on File Prior to Visit  Medication Sig Dispense Refill  . Azelastine-Fluticasone (DYMISTA) 137-50 MCG/ACT SUSP Place 1 spray into the nose 2 (two) times daily.  23 g  3  . diltiazem (CARDIZEM CD) 180 MG 24 hr capsule Take 1 capsule (180 mg total) by mouth daily.  90 capsule  1  . diphenoxylate-atropine (LOMOTIL) 2.5-0.025 MG per tablet Take 1 tablet by mouth 4 (four) times daily as needed for diarrhea or loose stools.  60 tablet  0  . Loperamide HCl (IMODIUM PO) Take by mouth.      . losartan-hydrochlorothiazide (HYZAAR) 100-12.5 MG per tablet Take 1 tablet by mouth daily.  90 tablet  4  . Multiple Vitamin (MULTIVITAMIN) tablet Take 1 tablet by mouth daily.      . tadalafil (CIALIS) 5 MG tablet Take 1 tablet (5 mg total) by mouth daily as needed for erectile dysfunction.  10 tablet  0   No current facility-administered medications on file prior to visit.     Review of Systems  Skin: Positive for rash.       bil forearms       Objective:   Physical Exam  Skin: Rash noted. No erythema.  Flat, rash on bilateral forearms          Assessment & Plan:

## 2013-06-28 NOTE — Assessment & Plan Note (Signed)
Triamcinolone cream to affect area twice a day. If no improvement in 1-2 weeks will refer to derm

## 2013-06-28 NOTE — Patient Instructions (Addendum)
  Apply triamcinolone cream (Kenalog) to the affected area twice a day.  You can take benadryl if needed for the itching.  If this is not improved within 1-2 weeks please let us know

## 2013-07-09 ENCOUNTER — Telehealth: Payer: Self-pay | Admitting: Internal Medicine

## 2013-07-09 ENCOUNTER — Other Ambulatory Visit: Payer: Self-pay | Admitting: Adult Health

## 2013-07-09 MED ORDER — PREDNISONE 10 MG PO TABS: ORAL_TABLET | ORAL | Status: DC

## 2013-07-09 NOTE — Telephone Encounter (Signed)
Pt notified that Prednisone taper was sent to the pharmacy and to call back with failure of improvement.

## 2013-07-09 NOTE — Telephone Encounter (Signed)
Pt is needing something stronger sent into his pharmacy for the rash he has. He was seen recently in the office and it is not getting any beter.

## 2013-09-09 ENCOUNTER — Ambulatory Visit: Payer: 59 | Admitting: Adult Health

## 2013-09-10 ENCOUNTER — Ambulatory Visit (INDEPENDENT_AMBULATORY_CARE_PROVIDER_SITE_OTHER): Payer: 59 | Admitting: Adult Health

## 2013-09-10 ENCOUNTER — Encounter: Payer: Self-pay | Admitting: Adult Health

## 2013-09-10 VITALS — BP 126/98 | HR 95 | Temp 98.2°F | Wt 330.0 lb

## 2013-09-10 DIAGNOSIS — R1013 Epigastric pain: Secondary | ICD-10-CM

## 2013-09-10 LAB — CBC WITH DIFFERENTIAL/PLATELET
Basophils Absolute: 0 10*3/uL (ref 0.0–0.1)
Basophils Relative: 1 % (ref 0–1)
Eosinophils Absolute: 0.2 10*3/uL (ref 0.0–0.7)
Eosinophils Relative: 2 % (ref 0–5)
HCT: 45.4 % (ref 39.0–52.0)
Hemoglobin: 15.8 g/dL (ref 13.0–17.0)
Lymphocytes Relative: 38 % (ref 12–46)
Lymphs Abs: 3.3 10*3/uL (ref 0.7–4.0)
MCH: 31.5 pg (ref 26.0–34.0)
MCHC: 34.8 g/dL (ref 30.0–36.0)
MCV: 90.4 fL (ref 78.0–100.0)
Monocytes Absolute: 0.8 10*3/uL (ref 0.1–1.0)
Monocytes Relative: 9 % (ref 3–12)
Neutro Abs: 4.3 10*3/uL (ref 1.7–7.7)
Neutrophils Relative %: 50 % (ref 43–77)
Platelets: 301 10*3/uL (ref 150–400)
RBC: 5.02 MIL/uL (ref 4.22–5.81)
RDW: 13.6 % (ref 11.5–15.5)
WBC: 8.7 10*3/uL (ref 4.0–10.5)

## 2013-09-10 NOTE — Assessment & Plan Note (Signed)
Patient has stopped drinking alcohol for 2 weeks with some improvement of his symptoms. Suspect gastritis versus ulcer. Increase omeprazole to 20 mg twice a day. Check CBC, comprehensive metabolic panel and H. pylori. Consider referral to GI.

## 2013-09-10 NOTE — Progress Notes (Signed)
  Subjective:    Patient ID: Eric Stevens, male    DOB: 08-02-1971, 42 y.o.   MRN: [id number]  HPI  Pt reports intermittent abd pain for several weeks.  Pt states pain is worse after eating or drinking, particularly when drinking alcohol. Pt states pain is in mid upper and mid lower abd. Pt denies nausea or vomiting. Pt reports more frequent bowel movements but denies loose stools.  Denies blood in stools. Denies burping or burning sensation in throat.  Denies fevers or chills, denies urinary sx. Patient has very vague recollection of symptoms and is a poor historian.   Current Outpatient Prescriptions on File Prior to Visit  Medication Sig Dispense Refill  . diltiazem (CARDIZEM CD) 180 MG 24 hr capsule Take 1 capsule (180 mg total) by mouth daily.  90 capsule  1  . losartan-hydrochlorothiazide (HYZAAR) 100-12.5 MG per tablet Take 1 tablet by mouth daily.  90 tablet  4  . Azelastine-Fluticasone (DYMISTA) 137-50 MCG/ACT SUSP Place 1 spray into the nose 2 (two) times daily.  23 g  3  . diphenoxylate-atropine (LOMOTIL) 2.5-0.025 MG per tablet Take 1 tablet by mouth 4 (four) times daily as needed for diarrhea or loose stools.  60 tablet  0  . Loperamide HCl (IMODIUM PO) Take by mouth.      . Multiple Vitamin (MULTIVITAMIN) tablet Take 1 tablet by mouth daily.      . predniSONE (DELTASONE) 10 MG tablet Take 60 mg (6 tablets on day 1) then decrease by 10 mg (1 tablet) daily until done.  21 tablet  0  . tadalafil (CIALIS) 5 MG tablet Take 1 tablet (5 mg total) by mouth daily as needed for erectile dysfunction.  10 tablet  0  . triamcinolone cream (KENALOG) 0.1 % Apply topically 2 (two) times daily.  30 g  0   No current facility-administered medications on file prior to visit.     Review of Systems  Constitutional: Negative for fever, chills, fatigue and unexpected weight change.  Respiratory: Negative for cough, chest tightness and shortness of breath.   Cardiovascular: Negative for chest pain.   Gastrointestinal: Positive for abdominal pain. Negative for nausea, vomiting, diarrhea, constipation, blood in stool and abdominal distention.  Genitourinary: Negative for dysuria, hematuria and flank pain.  Musculoskeletal: Negative for back pain.  Skin: Negative for color change and rash.  Psychiatric/Behavioral: Negative.        Objective:   Physical Exam  Constitutional: He is oriented to person, place, and time. He appears well-developed and well-nourished.  Abdominal: Soft. Bowel sounds are normal. He exhibits no distension and no mass. There is tenderness.  Epigastric tenderness  Neurological: He is alert and oriented to person, place, and time.  Skin: Skin is warm and dry.  Psychiatric: He has a normal mood and affect. His behavior is normal. Judgment and thought content normal.    BP 126/98  Pulse 95  Temp(Src) 98.2 F (36.8 C) (Oral)  Wt 330 lb (149.687 kg)  BMI 41.25 kg/m2  SpO2 96%       Assessment & Plan:

## 2013-09-10 NOTE — Patient Instructions (Signed)
  Your symptoms may be related to gastritis or even an ulcer.  Please have your labs drawn prior to leaving the office.  Increase your omeprazole (Prilosec) to twice a day for 14 days.

## 2013-09-11 LAB — COMPREHENSIVE METABOLIC PANEL
ALT: 71 U/L — ABNORMAL HIGH (ref 0–53)
AST: 38 U/L — ABNORMAL HIGH (ref 0–37)
Albumin: 4.3 g/dL (ref 3.5–5.2)
Alkaline Phosphatase: 51 U/L (ref 39–117)
BUN: 18 mg/dL (ref 6–23)
CO2: 27 mEq/L (ref 19–32)
Calcium: 9.8 mg/dL (ref 8.4–10.5)
Chloride: 104 mEq/L (ref 96–112)
Creat: 1.11 mg/dL (ref 0.50–1.35)
Glucose, Bld: 65 mg/dL — ABNORMAL LOW (ref 70–99)
Potassium: 4.9 mEq/L (ref 3.5–5.3)
Sodium: 140 mEq/L (ref 135–145)
Total Bilirubin: 0.5 mg/dL (ref 0.3–1.2)
Total Protein: 7.3 g/dL (ref 6.0–8.3)

## 2013-09-13 ENCOUNTER — Other Ambulatory Visit: Payer: Self-pay | Admitting: Adult Health

## 2013-09-13 ENCOUNTER — Telehealth: Payer: Self-pay | Admitting: *Deleted

## 2013-09-13 DIAGNOSIS — R748 Abnormal levels of other serum enzymes: Secondary | ICD-10-CM

## 2013-09-13 LAB — H. PYLORI ANTIBODY, IGG: H Pylori IgG: <0.4 {ISR}

## 2013-09-13 NOTE — Telephone Encounter (Signed)
Spoke with pt. He is aware of apt, not sure if it will work. Will let me know tomorrow.

## 2013-09-13 NOTE — Telephone Encounter (Signed)
Pt returned your call.  

## 2013-09-16 ENCOUNTER — Ambulatory Visit: Payer: Self-pay | Admitting: Adult Health

## 2013-09-21 ENCOUNTER — Other Ambulatory Visit: Payer: Self-pay | Admitting: Adult Health

## 2013-09-21 DIAGNOSIS — R748 Abnormal levels of other serum enzymes: Secondary | ICD-10-CM

## 2013-09-21 NOTE — Progress Notes (Signed)
Ultrasound shows fatty liver. I am referring him to GI

## 2013-09-22 ENCOUNTER — Encounter: Payer: Self-pay | Admitting: Emergency Medicine

## 2013-09-22 ENCOUNTER — Encounter: Payer: Self-pay | Admitting: *Deleted

## 2013-09-22 NOTE — Progress Notes (Signed)
Sent myChart message

## 2013-09-23 ENCOUNTER — Other Ambulatory Visit: Payer: Self-pay

## 2013-09-24 ENCOUNTER — Ambulatory Visit: Payer: 59 | Admitting: Internal Medicine

## 2013-10-04 ENCOUNTER — Encounter: Payer: Self-pay | Admitting: Adult Health

## 2013-10-04 ENCOUNTER — Encounter: Payer: Self-pay | Admitting: Internal Medicine

## 2013-10-04 ENCOUNTER — Ambulatory Visit (INDEPENDENT_AMBULATORY_CARE_PROVIDER_SITE_OTHER): Payer: 59 | Admitting: Internal Medicine

## 2013-10-04 VITALS — BP 130/92 | HR 85 | Temp 98.3°F | Wt 328.0 lb

## 2013-10-04 DIAGNOSIS — I10 Essential (primary) hypertension: Secondary | ICD-10-CM

## 2013-10-04 DIAGNOSIS — R748 Abnormal levels of other serum enzymes: Secondary | ICD-10-CM

## 2013-10-04 DIAGNOSIS — R1013 Epigastric pain: Secondary | ICD-10-CM

## 2013-10-04 LAB — COMPREHENSIVE METABOLIC PANEL
ALT: 52 U/L (ref 0–53)
AST: 32 U/L (ref 0–37)
Albumin: 4.1 g/dL (ref 3.5–5.2)
Alkaline Phosphatase: 49 U/L (ref 39–117)
BUN: 17 mg/dL (ref 6–23)
CO2: 24 mEq/L (ref 19–32)
Calcium: 9.6 mg/dL (ref 8.4–10.5)
Chloride: 104 mEq/L (ref 96–112)
Creatinine, Ser: 1.1 mg/dL (ref 0.4–1.5)
GFR: 97.97 mL/min (ref >60.00)
Glucose, Bld: 107 mg/dL — ABNORMAL HIGH (ref 70–99)
Potassium: 4 mEq/L (ref 3.5–5.1)
Sodium: 138 mEq/L (ref 135–145)
Total Bilirubin: 0.6 mg/dL (ref 0.3–1.2)
Total Protein: 7.5 g/dL (ref 6.0–8.3)

## 2013-10-04 LAB — PROTIME-INR
INR: 1.1 ratio — ABNORMAL HIGH (ref 0.8–1.0)
Prothrombin Time: 11.4 s (ref 10.2–12.4)

## 2013-10-04 LAB — TSH: TSH: 0.89 u[IU]/mL (ref 0.35–5.50)

## 2013-10-04 NOTE — Assessment & Plan Note (Signed)
BP Readings from Last 3 Encounters:  10/04/13 130/92  09/10/13 126/98  06/28/13 132/84   BP generally well controlled on current medications. Encouraged better compliance with healthy, low sodium diet and regular exercise.

## 2013-10-04 NOTE — Progress Notes (Signed)
Subjective:    Patient ID: Eric Stevens, male    DOB: 1971/02/05, 42 y.o.   MRN: [id number]  HPI 42 year old male with history of hypertension presents for followup after recent episode of epigastric pain. At his last visit, he was started on omeprazole. He reports improvement in symptoms of epigastric pain. He continues to take omeprazole. Testing for H. pylori was negative. He reports that he is generally feeling well. We reviewed recent labs today which showed increase in liver function tests. He has never been told in the past that he had elevated liver function tests. He has no known history of hepatitis.  Outpatient Encounter Prescriptions as of 10/04/2013  Medication Sig  . diltiazem (CARDIZEM CD) 180 MG 24 hr capsule Take 1 capsule (180 mg total) by mouth daily.  Marland Kitchen losartan-hydrochlorothiazide (HYZAAR) 100-12.5 MG per tablet Take 1 tablet by mouth daily.  Marland Kitchen omeprazole (PRILOSEC) 20 MG capsule Take 20 mg by mouth daily.  . Azelastine-Fluticasone (DYMISTA) 137-50 MCG/ACT SUSP Place 1 spray into the nose 2 (two) times daily.  . Multiple Vitamin (MULTIVITAMIN) tablet Take 1 tablet by mouth daily.  . tadalafil (CIALIS) 5 MG tablet Take 1 tablet (5 mg total) by mouth daily as needed for erectile dysfunction.  . triamcinolone cream (KENALOG) 0.1 % Apply topically 2 (two) times daily.   BP 130/92  Pulse 85  Temp(Src) 98.3 F (36.8 C) (Oral)  Wt 328 lb (148.78 kg)  SpO2 96%  Review of Systems  Constitutional: Negative for fever, chills, activity change, appetite change, fatigue and unexpected weight change.  Eyes: Negative for visual disturbance.  Respiratory: Negative for cough and shortness of breath.   Cardiovascular: Negative for chest pain, palpitations and leg swelling.  Gastrointestinal: Positive for abdominal pain (intermittent mild epigastric, now improved compared to previous visit). Negative for abdominal distention.  Genitourinary: Negative for dysuria, urgency and  difficulty urinating.  Musculoskeletal: Negative for arthralgias and gait problem.  Skin: Negative for color change and rash.  Hematological: Negative for adenopathy.  Psychiatric/Behavioral: Negative for sleep disturbance and dysphoric mood. The patient is not nervous/anxious.        Objective:   Physical Exam  Constitutional: He is oriented to person, place, and time. He appears well-developed and well-nourished. No distress.  HENT:  Head: Normocephalic and atraumatic.  Right Ear: External ear normal.  Left Ear: External ear normal.  Nose: Nose normal.  Mouth/Throat: Oropharynx is clear and moist. No oropharyngeal exudate.  Eyes: Conjunctivae and EOM are normal. Pupils are equal, round, and reactive to light. Right eye exhibits no discharge. Left eye exhibits no discharge. No scleral icterus.  Neck: Normal range of motion. Neck supple. No tracheal deviation present. No thyromegaly present.  Cardiovascular: Normal rate, regular rhythm and normal heart sounds.  Exam reveals no gallop and no friction rub.   No murmur heard. Pulmonary/Chest: Effort normal and breath sounds normal. No respiratory distress. He has no wheezes. He has no rales. He exhibits no tenderness.  Abdominal: Soft. Bowel sounds are normal. He exhibits no distension and no mass. There is no tenderness. There is no guarding.  Musculoskeletal: Normal range of motion. He exhibits no edema.  Lymphadenopathy:    He has no cervical adenopathy.  Neurological: He is alert and oriented to person, place, and time. No cranial nerve deficit. Coordination normal.  Skin: Skin is warm and dry. No rash noted. He is not diaphoretic. No erythema. No pallor.  Psychiatric: He has a normal mood and affect. His behavior is normal.  Judgment and thought content normal.          Assessment & Plan:

## 2013-10-04 NOTE — Addendum Note (Signed)
Addended by: Ronna Polio A on: 10/04/2013 04:46 PM   Modules accepted: Orders, Medications

## 2013-10-04 NOTE — Progress Notes (Signed)
Pre-visit discussion using our clinic review tool. No additional management support is needed unless otherwise documented below in the visit note.  

## 2013-10-04 NOTE — Assessment & Plan Note (Signed)
Elevated LFTs noted on recent labs. Suspect related to recent viral gastroenteritis and possible NASH. Discussed with pt today. Will recheck LFTs today and check for viral hepatitis, Wilson's, autoimmune thyroiditis. Pt has evaluation with GI scheduled for early next month.

## 2013-10-04 NOTE — Patient Instructions (Signed)
Appointment with Dr. Markham Jordan, Dec 3rd at 8:30am.

## 2013-10-04 NOTE — Assessment & Plan Note (Signed)
Symptoms have improved with use of omeprazole. H Pylori IgG was negative. GI evaluation pending for next month. We discussed that he may need upper endoscopy if symptoms are persistent.

## 2013-10-05 LAB — HEPATITIS B SURFACE ANTIBODY,QUALITATIVE: Hep B S Ab: NEGATIVE

## 2013-10-05 LAB — HEPATITIS C ANTIBODY: HCV Ab: NEGATIVE

## 2013-10-05 LAB — HEPATITIS B SURFACE ANTIGEN: Hepatitis B Surface Ag: NEGATIVE

## 2013-10-05 LAB — HEPATITIS A ANTIBODY, IGM: Hep A IgM: NONREACTIVE

## 2013-10-06 LAB — CERULOPLASMIN: Ceruloplasmin: 30 mg/dL (ref 20–60)

## 2013-10-07 LAB — ANTI-MICROSOMAL ANTIBODY LIVER / KIDNEY: LKM1 Ab: <=20 U (ref <=20.0)

## 2013-11-01 ENCOUNTER — Ambulatory Visit: Payer: 59 | Admitting: Internal Medicine

## 2013-12-30 ENCOUNTER — Ambulatory Visit: Payer: Self-pay | Admitting: Family Medicine

## 2014-01-31 ENCOUNTER — Other Ambulatory Visit: Payer: Self-pay | Admitting: Internal Medicine

## 2014-02-02 ENCOUNTER — Telehealth: Payer: Self-pay | Admitting: *Deleted

## 2014-02-02 NOTE — Telephone Encounter (Signed)
Patient left a voicemail message about a medication refill. Returned patient call but patient has a Research scientist (medical) that has not been set up, therefore I could not leave a message to request a return call.

## 2014-02-03 NOTE — Telephone Encounter (Signed)
Has a voicemail box that has been set up yet.

## 2014-02-07 ENCOUNTER — Telehealth: Payer: Self-pay | Admitting: Internal Medicine

## 2014-02-07 NOTE — Telephone Encounter (Signed)
Voicemail has been set up so could not leave a message.

## 2014-02-07 NOTE — Telephone Encounter (Deleted)
Patient was sent a Mychart message with information about getting Xray done.

## 2014-02-07 NOTE — Telephone Encounter (Signed)
Pt returned call.  F/u appt scheduled.  Pt advised refill will be completed.

## 2014-02-08 MED ORDER — DILTIAZEM HCL ER COATED BEADS 180 MG PO CP24: ORAL_CAPSULE | ORAL | Status: DC

## 2014-02-08 NOTE — Telephone Encounter (Signed)
Prescription sent to mail order pharmacy 

## 2014-02-23 ENCOUNTER — Encounter: Payer: Self-pay | Admitting: *Deleted

## 2014-03-03 ENCOUNTER — Ambulatory Visit: Payer: 59 | Admitting: Internal Medicine

## 2014-03-31 ENCOUNTER — Encounter: Payer: Self-pay | Admitting: Internal Medicine

## 2014-03-31 ENCOUNTER — Ambulatory Visit (INDEPENDENT_AMBULATORY_CARE_PROVIDER_SITE_OTHER): Payer: 59 | Admitting: Internal Medicine

## 2014-03-31 VITALS — BP 132/98 | HR 103 | Temp 98.2°F | Wt 319.8 lb

## 2014-03-31 DIAGNOSIS — E119 Type 2 diabetes mellitus without complications: Secondary | ICD-10-CM | POA: Insufficient documentation

## 2014-03-31 DIAGNOSIS — I10 Essential (primary) hypertension: Secondary | ICD-10-CM

## 2014-03-31 DIAGNOSIS — R739 Hyperglycemia, unspecified: Secondary | ICD-10-CM

## 2014-03-31 DIAGNOSIS — E669 Obesity, unspecified: Secondary | ICD-10-CM

## 2014-03-31 DIAGNOSIS — R7309 Other abnormal glucose: Secondary | ICD-10-CM

## 2014-03-31 MED ORDER — LOSARTAN POTASSIUM-HCTZ 100-12.5 MG PO TABS: 1.0000 | ORAL_TABLET | Freq: Every day | ORAL | Status: DC

## 2014-03-31 MED ORDER — CARVEDILOL 3.125 MG PO TABS: 3.1250 mg | ORAL_TABLET | Freq: Two times a day (BID) | ORAL | Status: DC

## 2014-03-31 MED ORDER — DILTIAZEM HCL ER COATED BEADS 240 MG PO CP24: ORAL_CAPSULE | ORAL | Status: DC

## 2014-03-31 NOTE — Patient Instructions (Addendum)
Increase Diltiazem to 240mg  daily. Continue Losartan-HCTZ.   We will set up evaluation with cardiology, to include ultrasound of your heart.  Check labs next week.  Follow up in 4 weeks.

## 2014-03-31 NOTE — Assessment & Plan Note (Addendum)
  BP Readings from Last 3 Encounters:  03/31/14 132/98  10/04/13 130/92  09/10/13 126/98   BP continues to be elevated. Will increase Diltiazem to 240mg  daily. EKG today shows left atrial enlargement. Will set up cardiology evaluation for possible ECHO.

## 2014-03-31 NOTE — Progress Notes (Signed)
Pre visit review using our clinic review tool, if applicable. No additional management support is needed unless otherwise documented below in the visit note. 

## 2014-03-31 NOTE — Progress Notes (Signed)
Subjective:    Patient ID: Eric Stevens, male    DOB: 12-Jan-1971, 43 y.o.   MRN: [id number]  HPI 43YO male presents for follow up. Feeling well. Compliant with meds. Has not been checking BP. No chest pain, palpitations. Recently had screening labs through his wife's work which showed elevated A1c 6.6%.   Review of Systems  Constitutional: Negative for fever, chills, activity change, appetite change, fatigue and unexpected weight change.  Eyes: Negative for visual disturbance.  Respiratory: Negative for cough and shortness of breath.   Cardiovascular: Negative for chest pain, palpitations and leg swelling.  Gastrointestinal: Negative for abdominal pain and abdominal distention.  Genitourinary: Negative for dysuria, urgency and difficulty urinating.  Musculoskeletal: Negative for arthralgias and gait problem.  Skin: Negative for color change and rash.  Hematological: Negative for adenopathy.  Psychiatric/Behavioral: Negative for sleep disturbance and dysphoric mood. The patient is not nervous/anxious.        Objective:    BP 132/98  Pulse 103  Temp(Src) 98.2 F (36.8 C) (Oral)  Wt 319 lb 12 oz (145.038 kg)  SpO2 96% Physical Exam  Constitutional: He is oriented to person, place, and time. He appears well-developed and well-nourished. No distress.  HENT:  Head: Normocephalic and atraumatic.  Right Ear: External ear normal.  Left Ear: External ear normal.  Nose: Nose normal.  Mouth/Throat: Oropharynx is clear and moist. No oropharyngeal exudate.  Eyes: Conjunctivae and EOM are normal. Pupils are equal, round, and reactive to light. Right eye exhibits no discharge. Left eye exhibits no discharge. No scleral icterus.  Neck: Normal range of motion. Neck supple. No tracheal deviation present. No thyromegaly present.  Cardiovascular: Normal rate, regular rhythm and normal heart sounds.  Exam reveals no gallop and no friction rub.   No murmur heard. Pulmonary/Chest: Effort  normal and breath sounds normal. No accessory muscle usage. Not tachypneic. No respiratory distress. He has no decreased breath sounds. He has no wheezes. He has no rhonchi. He has no rales. He exhibits no tenderness.  Musculoskeletal: Normal range of motion. He exhibits no edema.  Lymphadenopathy:    He has no cervical adenopathy.  Neurological: He is alert and oriented to person, place, and time. No cranial nerve deficit. Coordination normal.  Skin: Skin is warm and dry. No rash noted. He is not diaphoretic. No erythema. No pallor.  Psychiatric: He has a normal mood and affect. His behavior is normal. Judgment and thought content normal.          Assessment & Plan:   Problem List Items Addressed This Visit   Elevated blood sugar     Recent blood sugar elevated on screening lab. Will check A1c with labs through [facility].    Hypertension - Primary       BP Readings from Last 3 Encounters:  03/31/14 132/98  10/04/13 130/92  09/10/13 126/98   BP continues to be elevated. Will increase Diltiazem to 240mg  daily. EKG today shows left atrial enlargement. Will set up cardiology evaluation for possible ECHO.    Relevant Medications      losartan-hydrochlorothiazide (HYZAAR) 100-12.5 MG per tablet      diltiazem (CARDIZEM CD) 24 hr capsule   Other Relevant Orders      EKG 12-Lead (Completed)      Ambulatory referral to Cardiology   Obesity (BMI 30-39.9)      Wt Readings from Last 3 Encounters:  03/31/14 319 lb 12 oz (145.038 kg)  10/04/13 328 lb (148.78 kg)  09/10/13 330  lb (149.687 kg)   Encouraged healthy diet, and goal of exercise 80min 3x per week. Gave handout on Mediterranean style diet.        Return in about 4 weeks (around 04/28/2014) for Recheck.

## 2014-03-31 NOTE — Assessment & Plan Note (Signed)
Recent blood sugar elevated on screening lab. Will check A1c with labs through [facility].

## 2014-03-31 NOTE — Assessment & Plan Note (Signed)
Wt Readings from Last 3 Encounters:  03/31/14 319 lb 12 oz (145.038 kg)  10/04/13 328 lb (148.78 kg)  09/10/13 330 lb (149.687 kg)   Encouraged healthy diet, and goal of exercise 74min 3x per week. Gave handout on Mediterranean style diet.

## 2014-04-01 ENCOUNTER — Telehealth: Payer: Self-pay | Admitting: Internal Medicine

## 2014-04-01 ENCOUNTER — Other Ambulatory Visit: Payer: Self-pay | Admitting: *Deleted

## 2014-04-01 DIAGNOSIS — I10 Essential (primary) hypertension: Secondary | ICD-10-CM

## 2014-04-01 MED ORDER — DILTIAZEM HCL ER COATED BEADS 240 MG PO CP24: 240.0000 mg | ORAL_CAPSULE | Freq: Every day | ORAL | Status: DC

## 2014-04-01 NOTE — Telephone Encounter (Signed)
Relevant patient education assigned to patient using Emmi. ° °

## 2014-04-05 ENCOUNTER — Other Ambulatory Visit: Payer: Self-pay | Admitting: Internal Medicine

## 2014-04-06 LAB — COMPREHENSIVE METABOLIC PANEL
ALT: 46 IU/L — ABNORMAL HIGH (ref 0–44)
AST: 31 IU/L (ref 0–40)
Albumin/Globulin Ratio: 2 (ref 1.1–2.5)
Albumin: 4.3 g/dL (ref 3.5–5.5)
Alkaline Phosphatase: 61 IU/L (ref 39–117)
BUN/Creatinine Ratio: 15 (ref 9–20)
BUN: 18 mg/dL (ref 6–24)
CO2: 23 mmol/L (ref 18–29)
Calcium: 9.8 mg/dL (ref 8.7–10.2)
Chloride: 103 mmol/L (ref 97–108)
Creatinine, Ser: 1.2 mg/dL (ref 0.76–1.27)
GFR calc Af Amer: 86 mL/min/{1.73_m2} (ref >59)
GFR calc non Af Amer: 75 mL/min/{1.73_m2} (ref >59)
Globulin, Total: 2.1 g/dL (ref 1.5–4.5)
Glucose: 115 mg/dL — ABNORMAL HIGH (ref 65–99)
Potassium: 4.6 mmol/L (ref 3.5–5.2)
Sodium: 141 mmol/L (ref 134–144)
Total Bilirubin: 0.4 mg/dL (ref 0.0–1.2)
Total Protein: 6.4 g/dL (ref 6.0–8.5)

## 2014-04-06 LAB — LIPID PANEL W/O CHOL/HDL RATIO
Cholesterol, Total: 152 mg/dL (ref 100–199)
HDL: 32 mg/dL — ABNORMAL LOW (ref >39)
LDL Calculated: 96 mg/dL (ref 0–99)
Triglycerides: 120 mg/dL (ref 0–149)
VLDL Cholesterol Cal: 24 mg/dL (ref 5–40)

## 2014-04-06 LAB — CBC WITH DIFFERENTIAL
Basophils Absolute: 0 10*3/uL (ref 0.0–0.2)
Basos: 0 %
Eos: 3 %
Eosinophils Absolute: 0.3 10*3/uL (ref 0.0–0.4)
HCT: 46.2 % (ref 37.5–51.0)
Hemoglobin: 15.9 g/dL (ref 12.6–17.7)
Immature Grans (Abs): 0 10*3/uL (ref 0.0–0.1)
Immature Granulocytes: 0 %
Lymphocytes Absolute: 3.3 10*3/uL — ABNORMAL HIGH (ref 0.7–3.1)
Lymphs: 42 %
MCH: 31.7 pg (ref 26.6–33.0)
MCHC: 34.4 g/dL (ref 31.5–35.7)
MCV: 92 fL (ref 79–97)
Monocytes Absolute: 0.8 10*3/uL (ref 0.1–0.9)
Monocytes: 10 %
Neutrophils Absolute: 3.5 10*3/uL (ref 1.4–7.0)
Neutrophils Relative %: 45 %
Platelets: 303 10*3/uL (ref 150–379)
RBC: 5.02 x10E6/uL (ref 4.14–5.80)
RDW: 13.9 % (ref 12.3–15.4)
WBC: 7.9 10*3/uL (ref 3.4–10.8)

## 2014-04-06 LAB — HGB A1C W/O EAG: Hgb A1c MFr Bld: 6.5 % — ABNORMAL HIGH (ref 4.8–5.6)

## 2014-04-06 LAB — MICROALBUMIN / CREATININE URINE RATIO
Creatinine, Ur: 141 mg/dL (ref 22.0–328.0)
MICROALB/CREAT RATIO: 7.7 mg/g creat (ref 0.0–30.0)
Microalbumin, Urine: 10.8 ug/mL (ref 0.0–17.0)

## 2014-04-06 LAB — TSH: TSH: 1.56 u[IU]/mL (ref 0.450–4.500)

## 2014-04-06 LAB — VITAMIN D 25 HYDROXY (VIT D DEFICIENCY, FRACTURES): Vit D, 25-Hydroxy: 14.4 ng/mL — ABNORMAL LOW (ref 30.0–100.0)

## 2014-04-07 ENCOUNTER — Ambulatory Visit (INDEPENDENT_AMBULATORY_CARE_PROVIDER_SITE_OTHER): Payer: 59 | Admitting: Cardiovascular Disease

## 2014-04-07 ENCOUNTER — Encounter: Payer: Self-pay | Admitting: Cardiovascular Disease

## 2014-04-07 VITALS — BP 138/96 | HR 73 | Ht 75.0 in | Wt 319.2 lb

## 2014-04-07 DIAGNOSIS — R079 Chest pain, unspecified: Secondary | ICD-10-CM

## 2014-04-07 DIAGNOSIS — I10 Essential (primary) hypertension: Secondary | ICD-10-CM

## 2014-04-07 DIAGNOSIS — R0602 Shortness of breath: Secondary | ICD-10-CM

## 2014-04-07 NOTE — Assessment & Plan Note (Signed)
Blood pressure is reasonably controlled now on current medications. I had a prolonged discussion with the patient about the importance of lifestyle changes in order to decrease his risk of cardiovascular disease especially with his multiple risk factors and family history. Currently, he has no symptoms suggestive of angina. Continue treatment of risk factors.

## 2014-04-07 NOTE — Assessment & Plan Note (Signed)
The patient has chronic exertional dyspnea with prolonged history of hypertension. Her ECG today is normal. I recommend an echocardiogram to evaluate LV systolic/diastolic function and pulmonary pressure.

## 2014-04-07 NOTE — Patient Instructions (Signed)
Your physician has requested that you have an echocardiogram. Echocardiography is a painless test that uses sound waves to create images of your heart. It provides your doctor with information about the size and shape of your heart and how well your heart's chambers and valves are working. This procedure takes approximately one hour. There are no restrictions for this procedure.  Your physician recommends that you schedule a follow-up appointment in:  As needed   

## 2014-04-07 NOTE — Progress Notes (Signed)
Primary care physician: Dr. Gilford Rile  HPI  This is a 43 year old African American male who was referred for evaluation of an abnormal EKG which showed possible left atrial enlargement. He has prolonged history of hypertension, obesity and possible sleep apnea. He has no previous cardiac history. He has recurrent episodes of sharp chest discomfort lasting for a few seconds which happens at rest and not with physical activities. He has experienced chronic exertional dyspnea. No orthopnea or PND. He does have family history of coronary artery disease. He is a smoker and works as a Administrator. He has no previous history of myocardial infarction or stroke.  Allergies  Allergen Reactions  . Lisinopril     cough     Current Outpatient Prescriptions on File Prior to Visit  Medication Sig Dispense Refill  . diltiazem (CARDIZEM CD) 240 MG 24 hr capsule Take 1 capsule (240 mg total) by mouth daily.  90 capsule  0  . losartan-hydrochlorothiazide (HYZAAR) 100-12.5 MG per tablet Take 1 tablet by mouth daily.  90 tablet  4   No current facility-administered medications on file prior to visit.     Past Medical History  Diagnosis Date  . Hypertension   . Prostatitis     Dr. Eliberto Ivory  . Sleep apnea      History reviewed. No pertinent past surgical history.   Family History  Problem Relation Age of Onset  . Cancer Father     prostate  . Hypertension Father   . Diabetes Brother   . Aneurysm Mother     brain  . Hypertension Mother      History   Social History  . Marital Status: Married    Spouse Name: N/A    Number of Children: N/A  . Years of Education: N/A   Occupational History  . Not on file.   Social History Main Topics  . Smoking status: Current Some Day Smoker -- 5 years    Types: Cigarettes  . Smokeless tobacco: Never Used  . Alcohol Use: Yes     Comment: occassionally  . Drug Use: No  . Sexual Activity: Not on file   Other Topics Concern  . Not on file    Social History Narrative   Lives with wife in [place]. Children, [place] and 14YO.      Work - Biomedical scientist truck      Diet - regular, limited red meat      Exercise - occasionally     ROS A 10 point review of system was performed. It is negative other than that mentioned in the history of present illness.   PHYSICAL EXAM   BP 138/96  Pulse 73  Ht 6\' 3"  (1.905 m)  Wt 319 lb 4 oz (144.811 kg)  BMI 39.90 kg/m2 Constitutional: He is oriented to person, place, and time. He appears well-developed and well-nourished. No distress.  HENT: No nasal discharge.  Head: Normocephalic and atraumatic.  Eyes: Pupils are equal and round.  No discharge. Neck: Normal range of motion. Neck supple. No JVD present. No thyromegaly present.  Cardiovascular: Normal rate, regular rhythm, normal heart sounds. Exam reveals no gallop and no friction rub. No murmur heard.  Pulmonary/Chest: Effort normal and breath sounds normal. No stridor. No respiratory distress. He has no wheezes. He has no rales. He exhibits no tenderness.  Abdominal: Soft. Bowel sounds are normal. He exhibits no distension. There is no tenderness. There is no rebound and no guarding.  Musculoskeletal: Normal range of motion.  He exhibits no edema and no tenderness.  Neurological: He is alert and oriented to person, place, and time. Coordination normal.  Skin: Skin is warm and dry. No rash noted. He is not diaphoretic. No erythema. No pallor.  Psychiatric: He has a normal mood and affect. His behavior is normal. Judgment and thought content normal.       EKG: Normal sinus rhythm with no significant ST or T wave changes   ASSESSMENT AND PLAN

## 2014-04-08 ENCOUNTER — Other Ambulatory Visit: Payer: Self-pay | Admitting: *Deleted

## 2014-04-08 MED ORDER — METFORMIN HCL 500 MG PO TABS: 500.0000 mg | ORAL_TABLET | Freq: Two times a day (BID) | ORAL | Status: DC

## 2014-04-08 MED ORDER — VITAMIN D (ERGOCALCIFEROL) 1.25 MG (50000 UNIT) PO CAPS: 50000.0000 [IU] | ORAL_CAPSULE | ORAL | Status: DC

## 2014-04-13 ENCOUNTER — Encounter: Payer: Self-pay | Admitting: Internal Medicine

## 2014-04-13 ENCOUNTER — Ambulatory Visit (INDEPENDENT_AMBULATORY_CARE_PROVIDER_SITE_OTHER): Payer: 59 | Admitting: Internal Medicine

## 2014-04-13 VITALS — BP 128/90 | HR 78 | Temp 98.2°F | Ht 74.5 in | Wt 316.2 lb

## 2014-04-13 DIAGNOSIS — E119 Type 2 diabetes mellitus without complications: Secondary | ICD-10-CM

## 2014-04-13 DIAGNOSIS — I10 Essential (primary) hypertension: Secondary | ICD-10-CM

## 2014-04-13 DIAGNOSIS — M25531 Pain in right wrist: Secondary | ICD-10-CM | POA: Insufficient documentation

## 2014-04-13 DIAGNOSIS — E669 Obesity, unspecified: Secondary | ICD-10-CM

## 2014-04-13 DIAGNOSIS — M25539 Pain in unspecified wrist: Secondary | ICD-10-CM

## 2014-04-13 DIAGNOSIS — E559 Vitamin D deficiency, unspecified: Secondary | ICD-10-CM | POA: Insufficient documentation

## 2014-04-13 MED ORDER — DILTIAZEM HCL ER COATED BEADS 240 MG PO CP24: 240.0000 mg | ORAL_CAPSULE | Freq: Every day | ORAL | Status: DC

## 2014-04-13 NOTE — Progress Notes (Signed)
Pre visit review using our clinic review tool, if applicable. No additional management support is needed unless otherwise documented below in the visit note. 

## 2014-04-13 NOTE — Assessment & Plan Note (Signed)
BP Readings from Last 3 Encounters:  04/13/14 128/90  04/07/14 138/96  03/31/14 132/98   BP improved today. Will continue current medications. Discussed importance of low sodium, Mediterranean style diet and exercise.

## 2014-04-13 NOTE — Assessment & Plan Note (Signed)
Will get plain film for evaluation of fracture of distal ulna.

## 2014-04-13 NOTE — Assessment & Plan Note (Signed)
Reviewed recent labs with A1c of 6.5%. Encouraged use of Metformin 250mg  bid (trying lower dose given dizziness with 500mg  dosing). Recommended nutrition counseling, which he would like to defer for now. Plan follow up in 2 week and with repeat A1c in 3 months.

## 2014-04-13 NOTE — Assessment & Plan Note (Signed)
Wt Readings from Last 3 Encounters:  04/13/14 316 lb 4 oz (143.45 kg)  04/07/14 319 lb 4 oz (144.811 kg)  03/31/14 319 lb 12 oz (145.038 kg)   Congratulated pt on weight loss. Encouraged continued effort at healthy diet and exercise.

## 2014-04-13 NOTE — Progress Notes (Signed)
Subjective:    Patient ID: Eric Stevens, male    DOB: [birth year], 43 y.o.   MRN: [id number]  HPI 43YO male presents for follow up.  DM - recent A1c elevated in range of DM at 6.5%. BG near 100-120 fasting and near 150-170s after meals. Felt dizzy when taking Metformin, so stopped medication. Working on [employer].  HTN - Compliant with meds. Has not checked BP at home because does not have a cuff that will fit.  Wrist pain - Was recently trying to move a large tire and developed pain in right wrist. No swelling noted. Occasional decreased strength in grip.  Review of Systems  Constitutional: Negative for fever, chills, activity change, appetite change, fatigue and unexpected weight change.  Eyes: Negative for visual disturbance.  Respiratory: Negative for cough and shortness of breath.   Cardiovascular: Negative for chest pain, palpitations and leg swelling.  Gastrointestinal: Negative for abdominal pain and abdominal distention.  Genitourinary: Negative for dysuria, urgency and difficulty urinating.  Musculoskeletal: Positive for arthralgias (right wrist). Negative for gait problem.  Skin: Negative for color change and rash.  Hematological: Negative for adenopathy.  Psychiatric/Behavioral: Negative for sleep disturbance and dysphoric mood. The patient is not nervous/anxious.        Objective:    BP 128/90  Pulse 78  Temp(Src) 98.2 F (36.8 C) (Oral)  Ht 6' 2.5" (1.892 m)  Wt 316 lb 4 oz (143.45 kg)  BMI 40.07 kg/m2  SpO2 95% Physical Exam  Constitutional: He is oriented to person, place, and time. He appears well-developed and well-nourished. No distress.  HENT:  Head: Normocephalic and atraumatic.  Right Ear: External ear normal.  Left Ear: External ear normal.  Nose: Nose normal.  Mouth/Throat: Oropharynx is clear and moist. No oropharyngeal exudate.  Eyes: Conjunctivae and EOM are normal. Pupils are equal, round, and reactive to light. Right eye exhibits no  discharge. Left eye exhibits no discharge. No scleral icterus.  Neck: Normal range of motion. Neck supple. No tracheal deviation present. No thyromegaly present.  Cardiovascular: Normal rate, regular rhythm and normal heart sounds.  Exam reveals no gallop and no friction rub.   No murmur heard. Pulmonary/Chest: Effort normal and breath sounds normal. No accessory muscle usage. Not tachypneic. No respiratory distress. He has no decreased breath sounds. He has no wheezes. He has no rhonchi. He has no rales. He exhibits no tenderness.  Musculoskeletal: Normal range of motion. He exhibits no edema.       Right wrist: He exhibits tenderness (lateral wrist) and bony tenderness (Distal ulna). He exhibits normal range of motion, no swelling and no deformity.  Lymphadenopathy:    He has no cervical adenopathy.  Neurological: He is alert and oriented to person, place, and time. No cranial nerve deficit. Coordination normal.  Skin: Skin is warm and dry. No rash noted. He is not diaphoretic. No erythema. No pallor.  Psychiatric: He has a normal mood and affect. His behavior is normal. Judgment and thought content normal.          Assessment & Plan:   Problem List Items Addressed This Visit     Unprioritized   Diabetes mellitus, type II - Primary     Reviewed recent labs with A1c of 6.5%. Encouraged use of Metformin 250mg  bid (trying lower dose given dizziness with 500mg  dosing). Recommended nutrition counseling, which he would like to defer for now. Plan follow up in 2 week and with repeat A1c in 3 months.  Hypertension      BP Readings from Last 3 Encounters:  04/13/14 128/90  04/07/14 138/96  03/31/14 132/98   BP improved today. Will continue current medications. Discussed importance of low sodium, Mediterranean style diet and exercise.    Relevant Medications      diltiazem (CARDIZEM CD) 24 hr capsule   Obesity (BMI 30-39.9)      Wt Readings from Last 3 Encounters:  04/13/14 316 lb 4  oz (143.45 kg)  04/07/14 319 lb 4 oz (144.811 kg)  03/31/14 319 lb 12 oz (145.038 kg)   Congratulated pt on weight loss. Encouraged continued effort at healthy diet and exercise.    Right wrist pain     Will get plain film for evaluation of fracture of distal ulna.    Relevant Orders      DG Wrist Complete Right       Return in about 3 months (around 07/14/2014) for Recheck of Diabetes.

## 2014-04-13 NOTE — Patient Instructions (Addendum)
Try breaking Metformin in half, and take 250mg  twice daily.  Goal blood sugars 80-120 fasting and less 200 for all blood sugar readings after a meal.  Look at website for American Diabetes Association for information.  Diabetes Meal Planning Guide The diabetes meal planning guide is a tool to help you plan your meals and snacks. It is important for people with diabetes to manage their blood glucose (sugar) levels. Choosing the right foods and the right amounts throughout your day will help control your blood glucose. Eating right can even help you improve your blood pressure and reach or maintain a healthy weight. CARBOHYDRATE COUNTING MADE EASY When you eat carbohydrates, they turn to sugar. This raises your blood glucose level. Counting carbohydrates can help you control this level so you feel better. When you plan your meals by counting carbohydrates, you can have more flexibility in what you eat and balance your medicine with your food intake. Carbohydrate counting simply means adding up the total amount of carbohydrate grams in your meals and snacks. Try to eat about the same amount at each meal. Foods with carbohydrates are listed below. Each portion below is 1 carbohydrate serving or 15 grams of carbohydrates. Ask your dietician how many grams of carbohydrates you should eat at each meal or snack. Grains and Starches  1 slice bread.   English muffin or hotdog/hamburger bun.   cup cold cereal (unsweetened).   cup cooked pasta or rice.   cup starchy vegetables (corn, potatoes, peas, beans, winter squash).  1 tortilla (6 inches).   bagel.  1 waffle or pancake (size of a CD).   cup cooked cereal.  4 to 6 small crackers. *Whole grain is recommended. Fruit  1 cup fresh unsweetened berries, melon, papaya, pineapple.  1 small fresh fruit.   banana or mango.   cup fruit juice (4 oz unsweetened).   cup canned fruit in natural juice or water.  2 tbs dried fruit.  12  to 15 grapes or cherries. Milk and Yogurt  1 cup fat-free or 1% milk.  1 cup soy milk.  6 oz light yogurt with sugar-free sweetener.  6 oz low-fat soy yogurt.  6 oz plain yogurt. Vegetables  1 cup raw or  cup cooked is counted as 0 carbohydrates or a "free" food.  If you eat 3 or more servings at 1 meal, count them as 1 carbohydrate serving. Other Carbohydrates   oz chips or pretzels.   cup ice cream or frozen yogurt.   cup sherbet or sorbet.  2 inch square cake, no frosting.  1 tbs honey, sugar, jam, jelly, or syrup.  2 small cookies.  3 squares of graham crackers.  3 cups popcorn.  6 crackers.  1 cup broth-based soup.  Count 1 cup casserole or other mixed foods as 2 carbohydrate servings.  Foods with less than 20 calories in a serving may be counted as 0 carbohydrates or a "free" food. You may want to purchase a book or computer software that lists the carbohydrate gram counts of different foods. In addition, the nutrition facts panel on the labels of the foods you eat are a good source of this information. The label will tell you how big the serving size is and the total number of carbohydrate grams you will be eating per serving. Divide this number by 15 to obtain the number of carbohydrate servings in a portion. Remember, 1 carbohydrate serving equals 15 grams of carbohydrate. SERVING SIZES Measuring foods and serving sizes  helps you make sure you are getting the right amount of food. The list below tells how big or small some common serving sizes are.  1 oz.........4 stacked dice.  3 oz........Marland KitchenDeck of cards.  1 tsp.......Marland KitchenTip of little finger.  1 tbs......Marland KitchenMarland KitchenThumb.  2 tbs.......Marland KitchenGolf ball.   cup......Marland KitchenHalf of a fist.  1 cup.......Marland KitchenA fist. SAMPLE DIABETES MEAL PLAN Below is a sample meal plan that includes foods from the grain and starches, dairy, vegetable, fruit, and meat groups. A dietician can individualize a meal plan to fit your calorie  needs and tell you the number of servings needed from each food group. However, controlling the total amount of carbohydrates in your meal or snack is more important than making sure you include all of the food groups at every meal. You may interchange carbohydrate containing foods (dairy, starches, and fruits). The meal plan below is an example of a 2000 calorie diet using carbohydrate counting. This meal plan has 17 carbohydrate servings. Breakfast  1 cup oatmeal (2 carb servings).   cup light yogurt (1 carb serving).  1 cup blueberries (1 carb serving).   cup almonds. Snack  1 large apple (2 carb servings).  1 low-fat string cheese stick. Lunch  Chicken breast salad.  1 cup spinach.   cup chopped tomatoes.  2 oz chicken breast, sliced.  2 tbs low-fat [place] dressing.  12 whole-wheat crackers (2 carb servings).  12 to 15 grapes (1 carb serving).  1 cup low-fat milk (1 carb serving). Snack  1 cup carrots.   cup hummus (1 carb serving). Dinner  3 oz broiled salmon.  1 cup brown rice (3 carb servings). Snack  1  cups steamed broccoli (1 carb serving) drizzled with 1 tsp olive oil and lemon juice.  1 cup light pudding (2 carb servings). DIABETES MEAL PLANNING WORKSHEET Your dietician can use this worksheet to help you decide how many servings of foods and what types of foods are right for you.  BREAKFAST Food Group and Servings / Carb Servings Grain/Starches __________________________________ Dairy __________________________________________ Vegetable ______________________________________ Fruit ___________________________________________ Meat __________________________________________ Fat ____________________________________________ LUNCH Food Group and Servings / Carb Servings Grain/Starches ___________________________________ Dairy ___________________________________________ Fruit ____________________________________________ Meat  ___________________________________________ Fat _____________________________________________ Eric Stevens Food Group and Servings / Carb Servings Grain/Starches ___________________________________ Dairy ___________________________________________ Fruit ____________________________________________ Meat ___________________________________________ Fat _____________________________________________ SNACKS Food Group and Servings / Carb Servings Grain/Starches ___________________________________ Dairy ___________________________________________ Vegetable _______________________________________ Fruit ____________________________________________ Meat ___________________________________________ Fat _____________________________________________ DAILY TOTALS Starches _________________________ Vegetable ________________________ Fruit ____________________________ Dairy ____________________________ Meat ____________________________ Fat ______________________________ Document Released: 08/01/2005 Document Revised: 01/27/2012 Document Reviewed: 06/12/2009 [employer] Patient Information 2014 [employer].

## 2014-04-14 ENCOUNTER — Telehealth: Payer: Self-pay

## 2014-04-14 NOTE — Telephone Encounter (Signed)
Relevant patient education assigned to patient using Emmi. ° °

## 2014-04-22 ENCOUNTER — Other Ambulatory Visit: Payer: 59

## 2014-04-28 ENCOUNTER — Ambulatory Visit: Payer: 59 | Admitting: Internal Medicine

## 2014-05-13 ENCOUNTER — Other Ambulatory Visit (INDEPENDENT_AMBULATORY_CARE_PROVIDER_SITE_OTHER): Payer: 59

## 2014-05-13 ENCOUNTER — Other Ambulatory Visit: Payer: Self-pay

## 2014-05-13 DIAGNOSIS — R0602 Shortness of breath: Secondary | ICD-10-CM

## 2014-05-13 DIAGNOSIS — R079 Chest pain, unspecified: Secondary | ICD-10-CM

## 2014-05-19 ENCOUNTER — Encounter: Payer: Self-pay | Admitting: Internal Medicine

## 2014-05-19 ENCOUNTER — Ambulatory Visit (INDEPENDENT_AMBULATORY_CARE_PROVIDER_SITE_OTHER): Payer: 59 | Admitting: Internal Medicine

## 2014-05-19 VITALS — BP 124/98 | HR 85 | Temp 98.4°F | Ht 74.5 in | Wt 314.2 lb

## 2014-05-19 DIAGNOSIS — E669 Obesity, unspecified: Secondary | ICD-10-CM

## 2014-05-19 DIAGNOSIS — B351 Tinea unguium: Secondary | ICD-10-CM

## 2014-05-19 DIAGNOSIS — E119 Type 2 diabetes mellitus without complications: Secondary | ICD-10-CM

## 2014-05-19 DIAGNOSIS — I10 Essential (primary) hypertension: Secondary | ICD-10-CM

## 2014-05-19 LAB — HM DIABETES FOOT EXAM: HM Diabetic Foot Exam: NORMAL

## 2014-05-19 MED ORDER — VITAMIN D (ERGOCALCIFEROL) 1.25 MG (50000 UNIT) PO CAPS: 50000.0000 [IU] | ORAL_CAPSULE | ORAL | Status: DC

## 2014-05-19 NOTE — Assessment & Plan Note (Signed)
Lab Results  Component Value Date   HGBA1C 6.5* 04/05/2014   Will stop metformin given diarrhea. Continue low glycemic diet and exercise. Recheck A1c in 2 months. Foot exam normal today.

## 2014-05-19 NOTE — Assessment & Plan Note (Signed)
Will set up referral to podiatry for laser treatment.

## 2014-05-19 NOTE — Progress Notes (Signed)
Pre visit review using our clinic review tool, if applicable. No additional management support is needed unless otherwise documented below in the visit note. 

## 2014-05-19 NOTE — Progress Notes (Signed)
Subjective:    Patient ID: Eric Stevens, male    DOB: 17-Oct-1971, 43 y.o.   MRN: [id number]  HPI 43YO male presents for follow up.  DM - running near 135. Taking Metformin but having diarrhea with this, so tried decreasing to once daily with no improvement. Following healthy, low sugar diet and exercising by playing basketball.  HTN - BP has been well controlled at other doctor visits, but does not check at home. Compliant with meds. Recent ECHO was normal  Review of Systems  Constitutional: Negative for fever, chills, activity change, appetite change, fatigue and unexpected weight change.  Eyes: Negative for visual disturbance.  Respiratory: Negative for cough and shortness of breath.   Cardiovascular: Negative for chest pain, palpitations and leg swelling.  Gastrointestinal: Negative for abdominal pain and abdominal distention.  Genitourinary: Negative for dysuria, urgency and difficulty urinating.  Musculoskeletal: Negative for arthralgias and gait problem.  Skin: Negative for color change and rash.  Hematological: Negative for adenopathy.  Psychiatric/Behavioral: Negative for sleep disturbance and dysphoric mood. The patient is not nervous/anxious.        Objective:    BP 124/98  Pulse 85  Temp(Src) 98.4 F (36.9 C) (Oral)  Ht 6' 2.5" (1.892 m)  Wt 314 lb 4 oz (142.543 kg)  BMI 39.82 kg/m2  SpO2 96% Physical Exam  Constitutional: He is oriented to person, place, and time. He appears well-developed and well-nourished. No distress.  HENT:  Head: Normocephalic and atraumatic.  Right Ear: External ear normal.  Left Ear: External ear normal.  Nose: Nose normal.  Mouth/Throat: Oropharynx is clear and moist. No oropharyngeal exudate.  Eyes: Conjunctivae and EOM are normal. Pupils are equal, round, and reactive to light. Right eye exhibits no discharge. Left eye exhibits no discharge. No scleral icterus.  Neck: Normal range of motion. Neck supple. No tracheal deviation  present. No thyromegaly present.  Cardiovascular: Normal rate, regular rhythm and normal heart sounds.  Exam reveals no gallop and no friction rub.   No murmur heard. Pulmonary/Chest: Effort normal and breath sounds normal. No accessory muscle usage. Not tachypneic. No respiratory distress. He has no decreased breath sounds. He has no wheezes. He has no rhonchi. He has no rales. He exhibits no tenderness.  Musculoskeletal: Normal range of motion. He exhibits no edema.  Lymphadenopathy:    He has no cervical adenopathy.  Neurological: He is alert and oriented to person, place, and time. No cranial nerve deficit. Coordination normal.  Skin: Skin is warm and dry. No rash noted. He is not diaphoretic. No erythema. No pallor.     Psychiatric: He has a normal mood and affect. His behavior is normal. Judgment and thought content normal.          Assessment & Plan:   Problem List Items Addressed This Visit     Unprioritized   Diabetes mellitus, type II - Primary      Lab Results  Component Value Date   HGBA1C 6.5* 04/05/2014   Will stop metformin given diarrhea. Continue low glycemic diet and exercise. Recheck A1c in 2 months. Foot exam normal today.    Hypertension      BP Readings from Last 3 Encounters:  05/19/14 124/98  04/13/14 128/90  04/07/14 138/96   BP well controlled on current medications. Will continue current meds. Recheck renal function in 2 months.    Obesity (BMI 30-39.9)      Wt Readings from Last 3 Encounters:  05/19/14 314 lb 4 oz (  142.543 kg)  04/13/14 316 lb 4 oz (143.45 kg)  04/07/14 319 lb 4 oz (144.811 kg)   Body mass index is 39.82 kg/(m^2). Encouraged healthy diet and exercise. Congratulated pt on weight loss.    Toenail fungus     Will set up referral to podiatry for laser treatment.    Relevant Orders      Ambulatory referral to Podiatry       Return in about 2 months (around 07/20/2014) for Recheck of Diabetes.

## 2014-05-19 NOTE — Patient Instructions (Addendum)
Labs 1-2 days before next visit.  Stop Metformin.  Continue healthy diet. Exercise when you can.  Follow up in 2 months.

## 2014-05-19 NOTE — Assessment & Plan Note (Signed)
Wt Readings from Last 3 Encounters:  05/19/14 314 lb 4 oz (142.543 kg)  04/13/14 316 lb 4 oz (143.45 kg)  04/07/14 319 lb 4 oz (144.811 kg)   Body mass index is 39.82 kg/(m^2). Encouraged healthy diet and exercise. Congratulated pt on weight loss.

## 2014-05-19 NOTE — Assessment & Plan Note (Signed)
BP Readings from Last 3 Encounters:  05/19/14 124/98  04/13/14 128/90  04/07/14 138/96   BP well controlled on current medications. Will continue current meds. Recheck renal function in 2 months.

## 2014-07-14 ENCOUNTER — Ambulatory Visit: Payer: 59 | Admitting: Internal Medicine

## 2014-07-21 ENCOUNTER — Ambulatory Visit: Payer: 59 | Admitting: Internal Medicine

## 2014-08-09 ENCOUNTER — Emergency Department: Payer: Self-pay | Admitting: Emergency Medicine

## 2014-08-09 LAB — URINALYSIS, COMPLETE
Bacteria: NONE SEEN
Bilirubin,UR: NEGATIVE
Blood: NEGATIVE
Glucose,UR: NEGATIVE mg/dL (ref 0–75)
Ketone: NEGATIVE
Leukocyte Esterase: NEGATIVE
Nitrite: NEGATIVE
Ph: 5 (ref 4.5–8.0)
Protein: NEGATIVE
RBC,UR: 2 /HPF (ref 0–5)
Specific Gravity: 1.024 (ref 1.003–1.030)
Squamous Epithelial: <1
WBC UR: 2 /HPF (ref 0–5)

## 2014-08-16 ENCOUNTER — Ambulatory Visit: Payer: 59 | Admitting: Internal Medicine

## 2014-10-05 ENCOUNTER — Other Ambulatory Visit: Payer: Self-pay

## 2014-10-05 MED ORDER — LOSARTAN POTASSIUM-HCTZ 100-12.5 MG PO TABS: 1.0000 | ORAL_TABLET | Freq: Every day | ORAL | Status: DC

## 2014-10-05 NOTE — Telephone Encounter (Signed)
Rx sent to pharmacy by escript  

## 2014-10-05 NOTE — Telephone Encounter (Signed)
The patient called and is hoping to get a refill on his blood pressure medication.  He did not know the name of the medication, only that it was for his blood pressure.    Callback [phone]

## 2014-10-18 ENCOUNTER — Other Ambulatory Visit: Payer: Self-pay | Admitting: *Deleted

## 2014-10-18 ENCOUNTER — Telehealth: Payer: Self-pay | Admitting: Internal Medicine

## 2014-10-18 MED ORDER — LOSARTAN POTASSIUM-HCTZ 100-12.5 MG PO TABS: 1.0000 | ORAL_TABLET | Freq: Every day | ORAL | Status: DC

## 2014-10-18 MED ORDER — DILTIAZEM HCL ER COATED BEADS 240 MG PO CP24: 240.0000 mg | ORAL_CAPSULE | Freq: Every day | ORAL | Status: DC

## 2014-10-18 NOTE — Telephone Encounter (Signed)
Rx sent to pharmacy   

## 2014-10-18 NOTE — Telephone Encounter (Signed)
Fine to refill both 90 day interval for 1 year.

## 2014-10-18 NOTE — Telephone Encounter (Signed)
Patient would like for the following prescriptions to be refilled at the [facility] on [facility] because he no longer has a mail order pharmacy:  1) Losartan Potassium-HCTZ and 2) Diltiazem HCI Coated Beads (Capsule SR 24 hr)

## 2014-10-26 ENCOUNTER — Ambulatory Visit: Payer: 59 | Admitting: Nurse Practitioner

## 2014-10-28 ENCOUNTER — Encounter: Payer: Self-pay | Admitting: Nurse Practitioner

## 2014-10-28 ENCOUNTER — Ambulatory Visit (INDEPENDENT_AMBULATORY_CARE_PROVIDER_SITE_OTHER): Payer: Managed Care, Other (non HMO) | Admitting: Nurse Practitioner

## 2014-10-28 VITALS — BP 148/100 | HR 85 | Temp 98.3°F | Resp 16 | Ht 74.5 in | Wt 328.0 lb

## 2014-10-28 DIAGNOSIS — IMO0001 Reserved for inherently not codable concepts without codable children: Secondary | ICD-10-CM

## 2014-10-28 DIAGNOSIS — I10 Essential (primary) hypertension: Secondary | ICD-10-CM

## 2014-10-28 DIAGNOSIS — E119 Type 2 diabetes mellitus without complications: Secondary | ICD-10-CM

## 2014-10-28 DIAGNOSIS — E1165 Type 2 diabetes mellitus with hyperglycemia: Secondary | ICD-10-CM

## 2014-10-28 LAB — HEMOGLOBIN A1C: Hgb A1c MFr Bld: 6.7 % — ABNORMAL HIGH (ref 4.6–6.5)

## 2014-10-28 NOTE — Progress Notes (Signed)
Subjective:    Patient ID: Eric Stevens, male    DOB: 1971-10-10, 43 y.o.   MRN: [id number]  HPI Eric Stevens is a 43 yo male with a CC of elevated blood sugars according to schedule however, he wants to discuss HTN.  1) DM type II-  Diet- Does not cut out or down on any particular food groups Exercise- lifts weights occasionally, less than 1 x weekly 05/19/14 A1c 6.5 normal foot exam, metformin gave pt diarrhea.   BS 115 this am, 120 last week, need re-check of A1c   2) HTN- Diltizem this morning lower dose had leftover at home. Ran out. Went to pharmacy picked up Losaratan/HCTZ today, wife put back Diltiazem due to insurance coverage.  Bp today 156/103 left arm Right 148/100 pulse 85      Review of Systems  Constitutional: Negative for fever, chills, diaphoresis, fatigue and unexpected weight change.  Eyes: Negative for visual disturbance.  Respiratory: Negative for cough, chest tightness and wheezing.   Cardiovascular: Negative for chest pain, palpitations and leg swelling.  Gastrointestinal: Positive for rectal pain. Negative for nausea, vomiting, diarrhea, constipation and blood in stool.       Rectal aching- seen in ED last month  Endocrine: Positive for polyuria. Negative for polydipsia and polyphagia.  Genitourinary: Negative for scrotal swelling and testicular pain.       Seen in ED for issues related to testicles  Neurological: Positive for headaches. Negative for dizziness and light-headedness.   Past Medical History  Diagnosis Date  . Hypertension   . Prostatitis     Dr. Eliberto Ivory  . Sleep apnea     History   Social History  . Marital Status: Married    Spouse Name: N/A    Number of Children: N/A  . Years of Education: N/A   Occupational History  . Not on file.   Social History Main Topics  . Smoking status: Current Some Day Smoker -- 5 years    Types: Cigarettes  . Smokeless tobacco: Never Used  . Alcohol Use: Yes     Comment: occassionally  . Drug Use:  No  . Sexual Activity: Not on file   Other Topics Concern  . Not on file   Social History Narrative   Lives with wife in [place]. Children, [place] and 14YO.      Work - Biomedical scientist truck      Diet - regular, limited red meat      Exercise - occasionally    No past surgical history on file.  Family History  Problem Relation Age of Onset  . Cancer Father     prostate  . Hypertension Father   . Diabetes Brother   . Aneurysm Mother     brain  . Hypertension Mother     Allergies  Allergen Reactions  . Lisinopril     cough  . Metformin And Related     diarrhea    Current Outpatient Prescriptions on File Prior to Visit  Medication Sig Dispense Refill  . aspirin 81 MG tablet Take 81 mg by mouth daily.    Marland Kitchen diltiazem (CARDIZEM CD) 240 MG 24 hr capsule Take 1 capsule (240 mg total) by mouth daily. 90 capsule 3  . losartan-hydrochlorothiazide (HYZAAR) 100-12.5 MG per tablet Take 1 tablet by mouth daily. 90 tablet 3  . omeprazole (PRILOSEC) 20 MG capsule Take 20 mg by mouth daily.     No current facility-administered medications on file prior to visit.  Objective:   Physical Exam  Constitutional: He is oriented to person, place, and time. He appears well-developed and well-nourished. No distress.  HENT:  Head: Normocephalic and atraumatic.  Cardiovascular: Normal rate, regular rhythm and intact distal pulses.   Pulmonary/Chest: Effort normal and breath sounds normal. No respiratory distress. He has no wheezes. He has no rales.  Genitourinary:  Deferred until another visit due to being seen recently in ER and pt denies problems other than some rectal pruritus today.   Neurological: He is alert and oriented to person, place, and time. No cranial nerve deficit.  Skin: Skin is warm and dry. No rash noted. He is not diaphoretic. No erythema. No pallor.  Psychiatric: He has a normal mood and affect. His behavior is normal. Judgment and thought content normal.     BP 148/100 mmHg  Pulse 85  Temp(Src) 98.3 F (36.8 C)  Resp 16  Ht 6' 2.5" (1.892 m)  Wt 328 lb (148.78 kg)  BMI 41.56 kg/m2  SpO2 98%      Assessment & Plan:  Recheck A1c today

## 2014-10-28 NOTE — Patient Instructions (Addendum)
Please stop by lab before leaving today.  Keep a healthy lifestyle by cutting down on sugars, bread, and other carbs.   Exercise goal of 30 min. A day 5 x a week.   We will follow up with BP and blood sugar next month.

## 2014-10-28 NOTE — Progress Notes (Signed)
Pre visit review using our clinic review tool, if applicable. No additional management support is needed unless otherwise documented below in the visit note. 

## 2014-10-30 NOTE — Assessment & Plan Note (Signed)
Uncontrolled. Pt ran out of medications. Ordered and wife picked up after he ran out. Pt's wife will pick up diltiazem today at the pharmacy. Pt okay with cost of medication for now due to prescription card found online. Pt instructed to keep a check on BP and pulse to bring in log for next visit.

## 2014-10-30 NOTE — Assessment & Plan Note (Addendum)
Uncontrolled. Pt not doing lifestyle changes we re discussed importance of this and what goals for exercise he should try for. A1c repeated today since seeing Dr. Gilford Rile. A1c was 6.7 and last visit was 6.5. Can not take Metformin due to GI effects. FU next month.

## 2014-11-19 IMAGING — US ABDOMEN ULTRASOUND LIMITED
1 series · 13 of 25 positions shown · non-contrast
Comparison: none

REASON FOR EXAM: elev liver enzymes abd pain
COMMENTS:

[Series 1: abdomen ultrasound limited · 0.33mm/px · 13 of 69 slices shown]
[im 1/69]
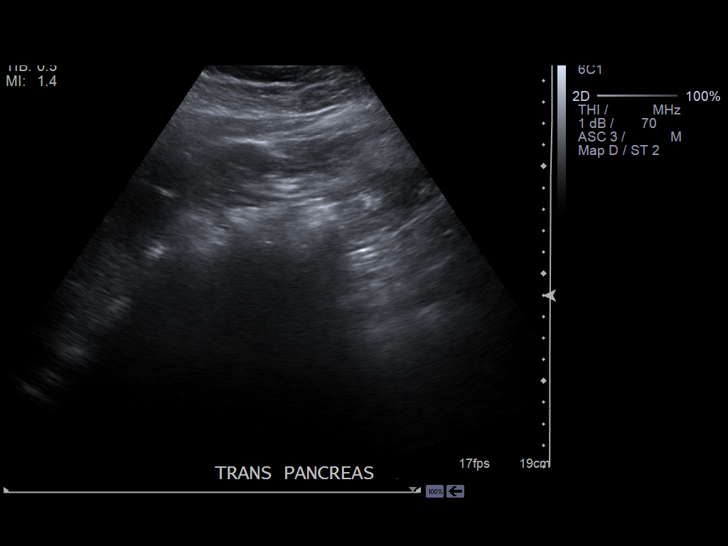
[im 6/69]
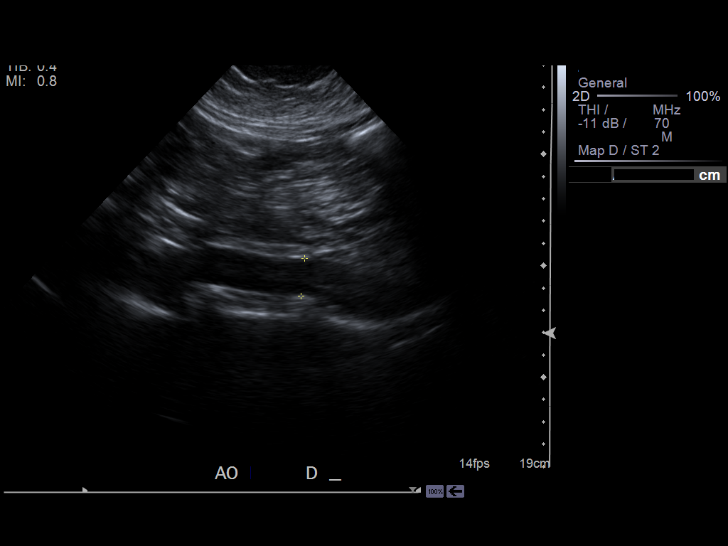
[im 12/69]
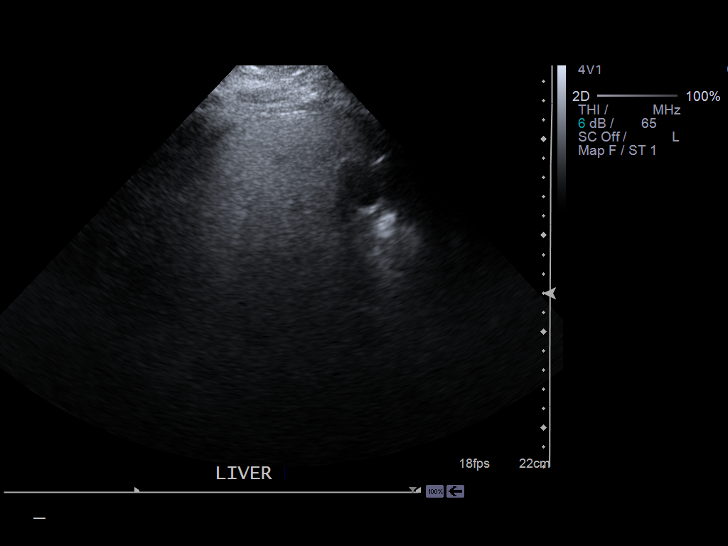
[im 18/69]
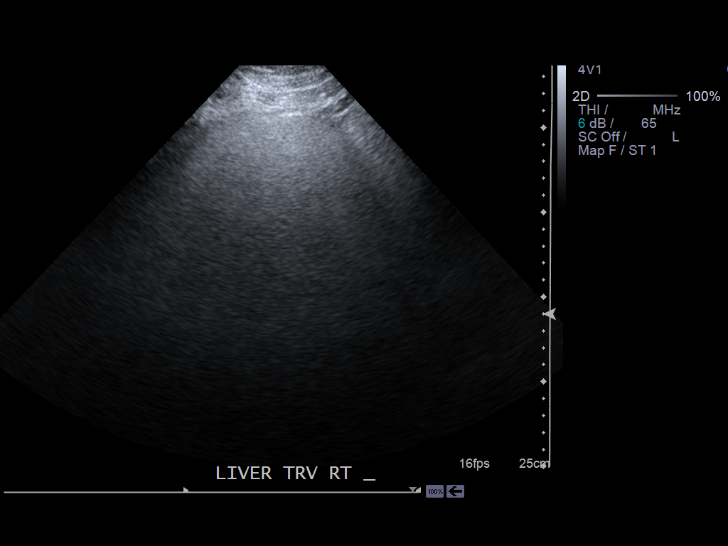
[im 23/69]
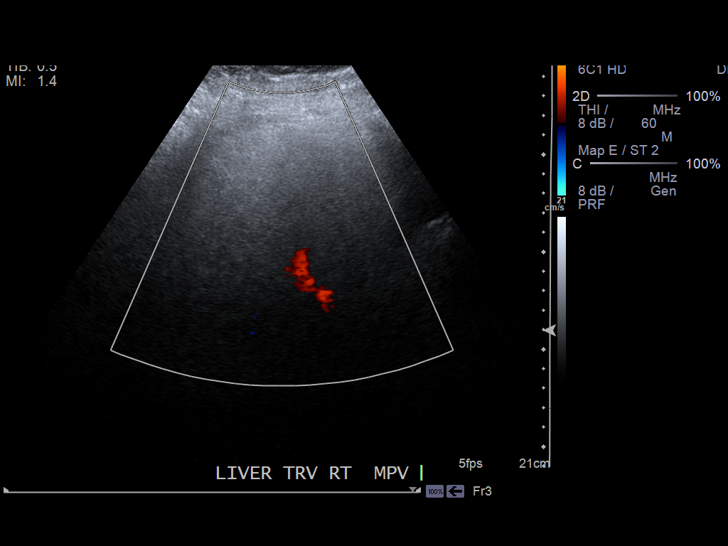
[im 29/69]
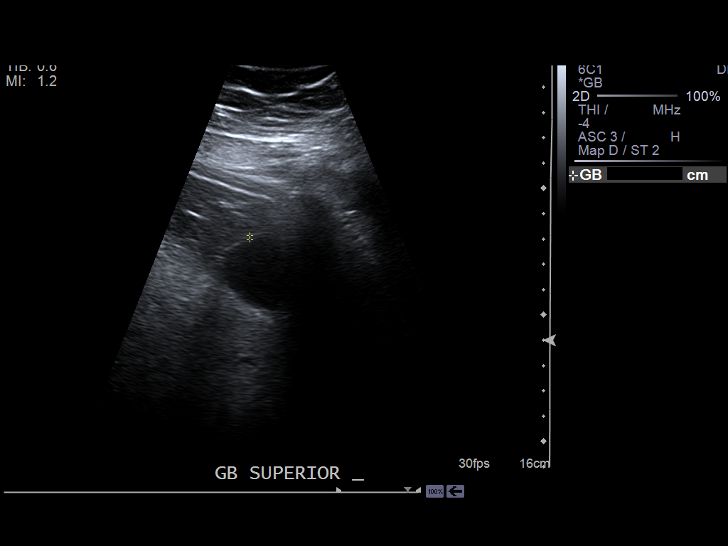
[im 35/69]
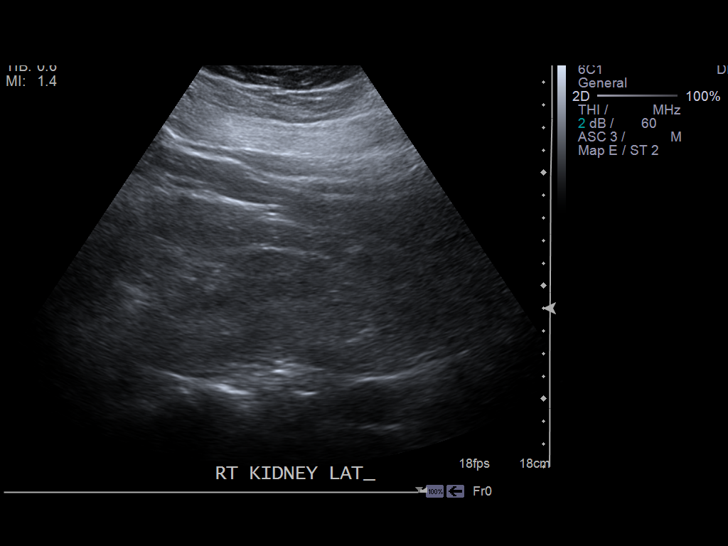
[im 40/69]
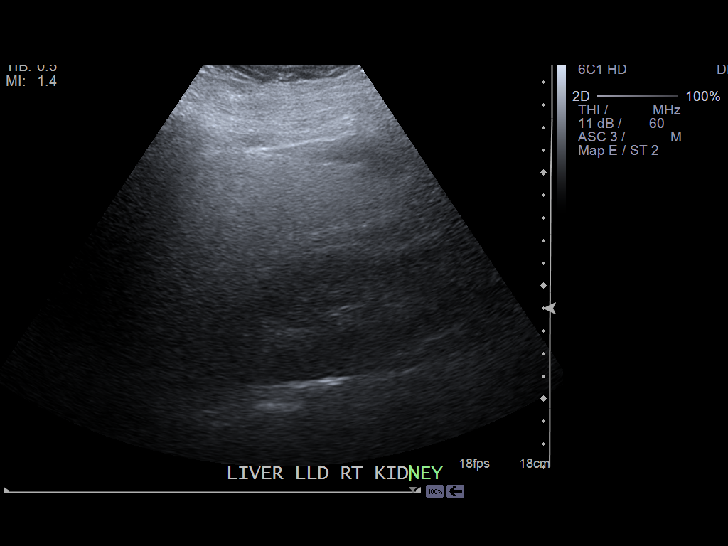
[im 46/69]
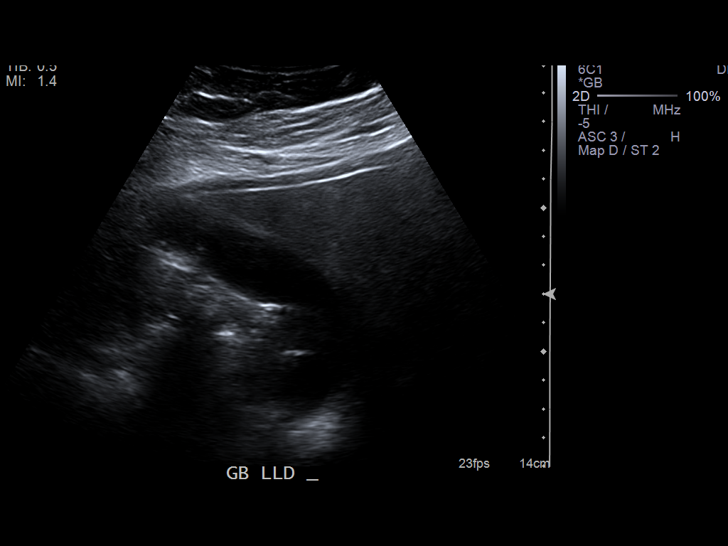
[im 52/69]
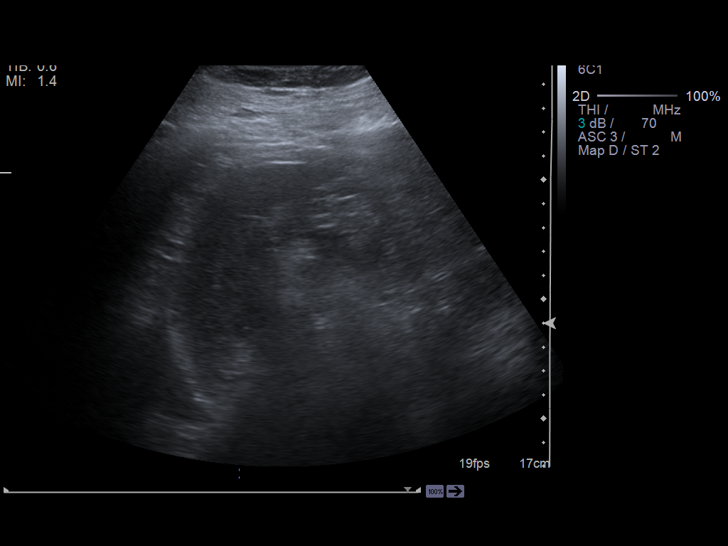
[im 57/69]
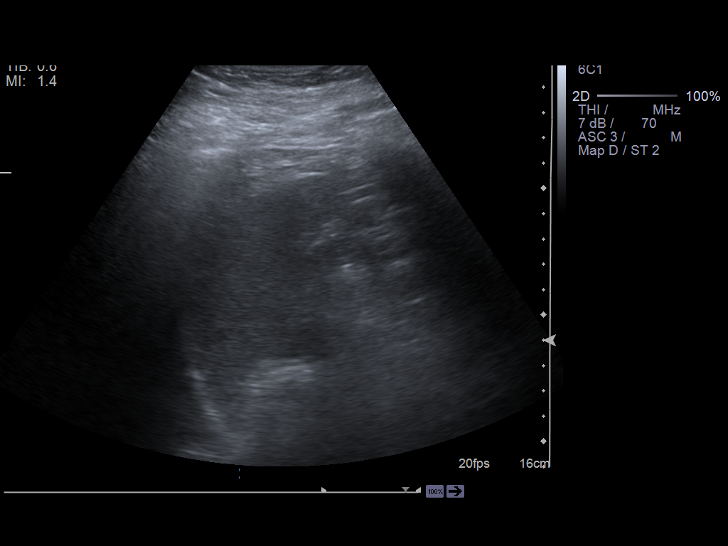
[im 63/69]
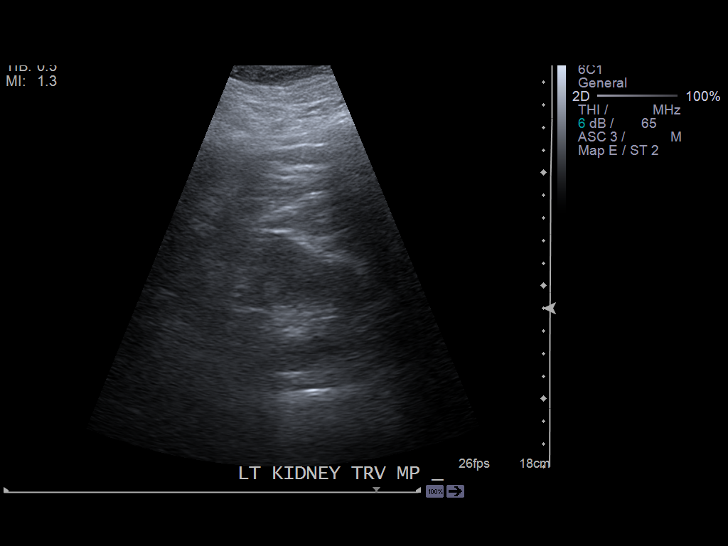
[im 69/69]
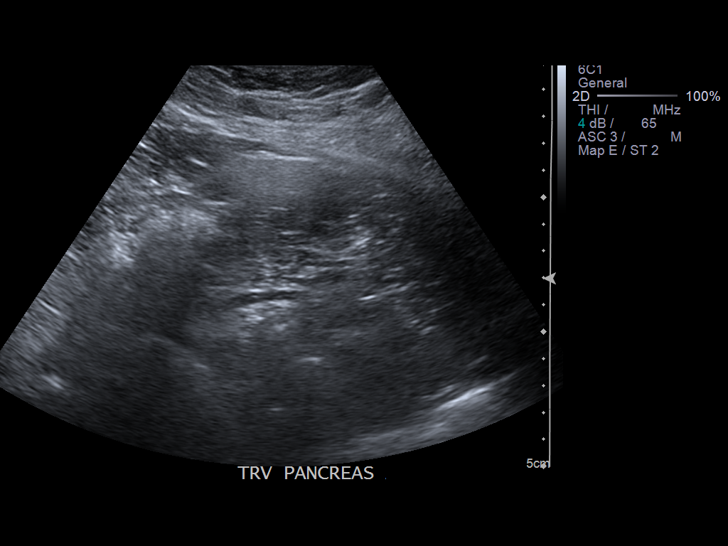

[13 of 25 positions shown; findings below may reference images not displayed]

PROCEDURE:     AUNTYJATTY - AUNTYJATTY ABDOMEN UPPER GENERAL  - September 16, 2013  [DATE]

RESULT:     Is abdominal ultrasound is performed. There is bowel gas
obscuring the pancreas. The abdominal aorta appears to be within normal
limits for size. The proximal inferior vena cava is not well seen because of
overlying bowel gas. The hepatic echotexture is increased and difficult to
penetrate. There is no definite mass or intrahepatic biliary ductal
dilation. Liver length is normal at 14.37 cm. No gallstones are evident.
There is a negative sonographic Murphy's sign. Gallbladder wall thickness is
1.4 mm. The common bile duct diameter is 3.4 mm. The right kidney measures
11.97 x 5.37 x 5.64 cm. The left kidney measures 12.15 x 5.14 x 5.63 cm. The
kidneys show no definite solid or cystic mass. There is no ascites. The
spleen measures 5.87 x 4.56 x 11.11 cm with a volume of 155.7 cc.
IMPRESSION: 1. A hepatic steatosis.
2. No cholelithiasis or evidence of acute cholecystitis.
3. Nonvisualization of the pancreas and poor visualization of the inferior
vena cava secondary to overlying bowel gas.

[REDACTED]

## 2014-11-20 ENCOUNTER — Emergency Department: Payer: Self-pay | Admitting: Emergency Medicine

## 2014-11-20 LAB — CBC
HCT: 47.1 % (ref 40.0–52.0)
HGB: 15.5 g/dL (ref 13.0–18.0)
MCH: 30.7 pg (ref 26.0–34.0)
MCHC: 32.8 g/dL (ref 32.0–36.0)
MCV: 94 fL (ref 80–100)
Platelet: 261 10*3/uL (ref 150–440)
RBC: 5.03 10*6/uL (ref 4.40–5.90)
RDW: 12.8 % (ref 11.5–14.5)
WBC: 9.5 10*3/uL (ref 3.8–10.6)

## 2014-11-20 LAB — BASIC METABOLIC PANEL
Anion Gap: 4 — ABNORMAL LOW (ref 7–16)
BUN: 14 mg/dL (ref 7–18)
Calcium, Total: 9.4 mg/dL (ref 8.5–10.1)
Chloride: 107 mmol/L (ref 98–107)
Co2: 31 mmol/L (ref 21–32)
Creatinine: 1.16 mg/dL (ref 0.60–1.30)
EGFR (African American): >60
EGFR (Non-African Amer.): >60
Glucose: 82 mg/dL (ref 65–99)
Osmolality: 283 (ref 275–301)
Potassium: 3.9 mmol/L (ref 3.5–5.1)
Sodium: 142 mmol/L (ref 136–145)

## 2014-11-20 LAB — TROPONIN I: Troponin-I: <0.02 ng/mL

## 2014-11-20 LAB — PRO B NATRIURETIC PEPTIDE: B-Type Natriuretic Peptide: 53 pg/mL (ref 0–125)

## 2014-11-21 LAB — LIPASE, BLOOD: Lipase: 189 U/L (ref 73–393)

## 2014-11-21 LAB — TROPONIN I: Troponin-I: <0.02 ng/mL

## 2014-12-02 ENCOUNTER — Encounter: Payer: Self-pay | Admitting: Nurse Practitioner

## 2014-12-02 ENCOUNTER — Ambulatory Visit (INDEPENDENT_AMBULATORY_CARE_PROVIDER_SITE_OTHER): Payer: Managed Care, Other (non HMO) | Admitting: Nurse Practitioner

## 2014-12-02 VITALS — BP 140/90 | HR 93 | Temp 98.3°F | Resp 14 | Ht 74.5 in | Wt 328.4 lb

## 2014-12-02 DIAGNOSIS — I10 Essential (primary) hypertension: Secondary | ICD-10-CM

## 2014-12-02 DIAGNOSIS — E119 Type 2 diabetes mellitus without complications: Secondary | ICD-10-CM

## 2014-12-02 NOTE — Progress Notes (Signed)
Pre visit review using our clinic review tool, if applicable. No additional management support is needed unless otherwise documented below in the visit note. 

## 2014-12-02 NOTE — Progress Notes (Signed)
Subjective:    Patient ID: Eric Stevens, male    DOB: [birth year], 44 y.o.   MRN: [id number]  HPI   Eric Stevens is a 44 yo male here for a follow up of HTN and elevated blood sugars.   1) Has meter at home, currently out of strips and lancets, pt unsure of meter name, he states he will call and let us known.   Last BS unknown  Diet- Not improved because of recent work hours  Exercise- No time since job's hours.  No changes since last visit.   Review of Systems  Constitutional: Negative for fever, chills, diaphoresis and fatigue.  Eyes: Negative for visual disturbance.  Respiratory: Negative for chest tightness, shortness of breath and wheezing.   Cardiovascular: Negative for chest pain, palpitations and leg swelling.  Gastrointestinal: Negative for nausea, vomiting and diarrhea.  Endocrine: Negative for polydipsia, polyphagia and polyuria.  Musculoskeletal: Negative for myalgias.  Skin: Negative for rash.  Neurological: Negative for dizziness, weakness, numbness and headaches.   Past Medical History  Diagnosis Date  . Hypertension   . Prostatitis     Dr. Eliberto Ivory  . Sleep apnea     History   Social History  . Marital Status: Married    Spouse Name: N/A    Number of Children: N/A  . Years of Education: N/A   Occupational History  . Not on file.   Social History Main Topics  . Smoking status: Current Some Day Smoker -- 5 years    Types: Cigarettes  . Smokeless tobacco: Never Used  . Alcohol Use: Yes     Comment: occassionally  . Drug Use: No  . Sexual Activity: Not on file   Other Topics Concern  . Not on file   Social History Narrative   Lives with wife in [place]. Children, [place] and 14YO.      Work - Biomedical scientist truck      Diet - regular, limited red meat      Exercise - occasionally    No past surgical history on file.  Family History  Problem Relation Age of Onset  . Cancer Father     prostate  . Hypertension Father   . Diabetes Brother    . Aneurysm Mother     brain  . Hypertension Mother     Allergies  Allergen Reactions  . Lisinopril     cough  . Metformin And Related     diarrhea    Current Outpatient Prescriptions on File Prior to Visit  Medication Sig Dispense Refill  . aspirin 81 MG tablet Take 81 mg by mouth daily.    Marland Kitchen diltiazem (CARDIZEM CD) 240 MG 24 hr capsule Take 1 capsule (240 mg total) by mouth daily. 90 capsule 3  . losartan-hydrochlorothiazide (HYZAAR) 100-12.5 MG per tablet Take 1 tablet by mouth daily. 90 tablet 3  . omeprazole (PRILOSEC) 20 MG capsule Take 20 mg by mouth daily.     No current facility-administered medications on file prior to visit.      Objective:   Physical Exam  Constitutional: He is oriented to person, place, and time. He appears well-developed and well-nourished. No distress.  Severely obese- pt is also very tall.   HENT:  Head: Normocephalic and atraumatic.  Right Ear: External ear normal.  Left Ear: External ear normal.  Cardiovascular: Normal rate, regular rhythm, normal heart sounds and intact distal pulses.  Exam reveals no gallop and no friction rub.   No  murmur heard. Pulmonary/Chest: Effort normal and breath sounds normal. No respiratory distress. He has no wheezes. He has no rales. He exhibits no tenderness.  Neurological: He is alert and oriented to person, place, and time.  Skin: Skin is warm and dry. No rash noted. He is not diaphoretic.  Psychiatric: He has a normal mood and affect. His behavior is normal. Judgment and thought content normal.    BP 140/90 mmHg  Pulse 93  Temp(Src) 98.3 F (36.8 C) (Oral)  Resp 14  Ht 6' 2.5" (1.892 m)  Wt 328 lb 6.4 oz (148.961 kg)  BMI 41.61 kg/m2  SpO2 97%     Assessment & Plan:

## 2014-12-02 NOTE — Patient Instructions (Signed)
Please make an appointment for March- Dr. Gilford Rile really misses you and would like to see you : )   Your last fasting glucose indicates you are at risk for developing diabetes, so I am checking an A1c today   I want you to lose 25 lbs over the next six months with a low glycemic index diet and regular exercise (30 minutes of cardio 5 days per week is your goal)  This is  my version of a  "Low GI"  Diet:  It will still lower your blood sugars and allow you to lose 4 to 8  lbs  per month if you follow it carefully.  Your goal with exercise is a minimum of 30 minutes of aerobic exercise 5 days per week (Walking does not count once it becomes easy!)     All of the foods can be found at grocery stores and in bulk at [employer].  The Atkins protein bars and shakes are available in more varieties at Target, [employer] and [place].     7 AM Breakfast:  Choose from the following:  Low carbohydrate Protein  Shakes (I recommend the EAS AdvantEdge "Carb Control" shakes  Or the low carb shakes by Atkins.    2.5 carbs   Arnold's "Sandwhich Thin"toasted  w/ peanut butter (no jelly: about 20 net carbs  "Bagel Thin" with cream cheese and salmon: about 20 carbs   a scrambled egg/bacon/cheese burrito made with Mission's "carb balance" whole wheat tortilla  (about 10 net carbs )  A slice of home made fritatta (egg based dish without a crust:  google it)    Avoid cereal and bananas, oatmeal and cream of wheat and grits. They are loaded with carbohydrates!   10 AM: high protein snack  Protein bar by Atkins (the snack size, under 200 cal, usually < 6 net carbs).    A stick of cheese:  Around 1 carb,  100 cal     Dannon Light n Fit [place] Yogurt  (80 cal, 8 carbs)  Other so called "protein bars" and Greek yogurts tend to be loaded with carbohydrates.  Remember, in food advertising, the word "energy" is synonymous for " carbohydrate."  Lunch:   A Sandwich using the bread choices listed, Can use any  Eggs,   lunchmeat, grilled meat or canned tuna), avocado, regular mayo/mustard  and cheese.  A Salad using blue cheese, ranch,  Goddess or vinagrette,  No croutons or "confetti" and no "candied nuts" but regular nuts OK.   No pretzels or chips.  Pickles and miniature sweet peppers are a good low carb alternative that provide a "crunch"  The bread is the only source of carbohydrate in a sandwich and  can be decreased by trying some of these alternatives to traditional loaf bread  Joseph's makes a pita bread and a flat bread that are 50 cal and 4 net carbs available at [place] and [place].  This can be toasted to use with hummous as well  Toufayan makes a low carb flatbread that's 100 cal and 9 net carbs available at [employer] and [employer]J's makes 2 sizes of  Low carb whole wheat tortilla  (The large one is 210 cal and 6 net carbs)  Flat Out makes flatbreads that are low carb as well  Avoid "Low fat dressings, as well as Barry Brunner and [place] dressings They are loaded with sugar!   3 PM/ Mid day  Snack:  Consider  1 ounce  of  almonds, walnuts, pistachios, pecans, peanuts,  Macadamia nuts or a nut medley.  Avoid "granola"; the dried cranberries and raisins are loaded with carbohydrates. Mixed nuts as long as there are no raisins,  cranberries or dried fruit.    Try the prosciutto/mozzarella cheese sticks by Fiorruci  In deli /backery section   High protein   To avoid overindulging in snacks: Try drinking a glass of unsweeted almond/coconut milk  Or a cup of coffee with your Atkins chocolate bar to keep you from having 3!!!   Pork rinds!  Yes Pork Rinds        6 PM  Dinner:     Meat/fowl/fish with a green salad, and either broccoli, cauliflower, green beans, spinach, brussel sprouts or  Lima beans. DO NOT BREAD THE PROTEIN!!      There is a low carb pasta by Dreamfield's that is acceptable and tastes great: only 5 digestible carbs/serving.( All grocery stores but BJs carry it )  Try Hurley Cisco Angelo's  chicken piccata or chicken or eggplant parm over low carb pasta.(Lowes and BJs)   Marjory Lies Sanchez's "Carnitas" (pulled pork, no sauce,  0 carbs) or his beef pot roast to make a dinner burrito (at BJ's)  Pesto over low carb pasta (bj's sells a good quality pesto in the center refrigerated section of the deli   Try satueeing  Cheral Marker with mushroooms  Whole wheat pasta is still full of digestible carbs and  Not as low in glycemic index as Dreamfield's.   Brown rice is still rice,  So skip the rice and noodles if you eat [place] or [place] (or at least limit to 1/2 cup)  9 PM snack :   Breyer's "low carb" fudgsicle or  ice cream bar (Carb Smart line), or  Weight Watcher's ice cream bar , or another "no sugar added" ice cream;  a serving of fresh berries/cherries with whipped cream   Cheese or DANNON'S LlGHT N FIT GREEK YOGURT or the Oikos greek yogurt   8 ounces of [place] unsweetened almond/cococunut milk  Cheese and crackers (using WASA crackers,  They are low carb) or peanut butter on low carb crackers or pita bread     Avoid bananas, pineapple, grapes  and watermelon on a regular basis because they are high in sugar.  THINK OF THEM AS DESSERT  Remember that snack Substitutions should be less than 10 NET carbs per serving and meals should be < 25 net carbs. Remember that carbohydrates from fiber do not affect blood sugar, so you can  subtract fiber grams to get the "net carbs " of any particular food item.

## 2014-12-03 NOTE — Assessment & Plan Note (Signed)
Uncontrolled. Pt to give [place] name of meter and we can call in strips and lancets for testing. A1c is 6.7 and he has not tolerated Metformin in past. Gave hand out of low glycemic index diet to follow. Pt verbalized understanding of diet and exercise importance. FU w/ Dr. Gilford Rile in next 2 months.

## 2014-12-03 NOTE — Assessment & Plan Note (Signed)
Stable. On Cardizem CD 240 mg 24 hr cap daily and Hyzaar 100-12.5 mg daily.   BP Readings from Last 3 Encounters:  12/02/14 140/90  10/28/14 148/100  05/19/14 124/98   Gave pt handout of low glycemic index diet and instructed to add some walking for weight loss.  FU w/ Dr. Gilford Rile in 2 months.

## 2015-01-27 ENCOUNTER — Ambulatory Visit: Payer: Managed Care, Other (non HMO) | Admitting: Internal Medicine

## 2015-10-13 ENCOUNTER — Encounter: Payer: Self-pay | Admitting: Emergency Medicine

## 2015-10-13 ENCOUNTER — Emergency Department
Admission: EM | Admit: 2015-10-13 | Discharge: 2015-10-13 | Disposition: A | Discharge status: Home / Self Care | Payer: Managed Care, Other (non HMO) | Attending: Emergency Medicine | Admitting: Emergency Medicine

## 2015-10-13 DIAGNOSIS — I10 Essential (primary) hypertension: Secondary | ICD-10-CM | POA: Insufficient documentation

## 2015-10-13 DIAGNOSIS — Z79899 Other long term (current) drug therapy: Secondary | ICD-10-CM | POA: Insufficient documentation

## 2015-10-13 DIAGNOSIS — Z7982 Long term (current) use of aspirin: Secondary | ICD-10-CM | POA: Insufficient documentation

## 2015-10-13 DIAGNOSIS — K2970 Gastritis, unspecified, without bleeding: Secondary | ICD-10-CM | POA: Insufficient documentation

## 2015-10-13 DIAGNOSIS — F17210 Nicotine dependence, cigarettes, uncomplicated: Secondary | ICD-10-CM | POA: Insufficient documentation

## 2015-10-13 DIAGNOSIS — E119 Type 2 diabetes mellitus without complications: Secondary | ICD-10-CM | POA: Insufficient documentation

## 2015-10-13 LAB — COMPREHENSIVE METABOLIC PANEL
ALT: 48 U/L (ref 17–63)
AST: 29 U/L (ref 15–41)
Albumin: 4 g/dL (ref 3.5–5.0)
Alkaline Phosphatase: 60 U/L (ref 38–126)
Anion gap: 6 (ref 5–15)
BUN: 20 mg/dL (ref 6–20)
CO2: 28 mmol/L (ref 22–32)
Calcium: 9.6 mg/dL (ref 8.9–10.3)
Chloride: 106 mmol/L (ref 101–111)
Creatinine, Ser: 1.29 mg/dL — ABNORMAL HIGH (ref 0.61–1.24)
GFR calc Af Amer: >60 mL/min (ref >60)
GFR calc non Af Amer: >60 mL/min (ref >60)
Glucose, Bld: 112 mg/dL — ABNORMAL HIGH (ref 65–99)
Potassium: 3.6 mmol/L (ref 3.5–5.1)
Sodium: 140 mmol/L (ref 135–145)
Total Bilirubin: 0.5 mg/dL (ref 0.3–1.2)
Total Protein: 7.3 g/dL (ref 6.5–8.1)

## 2015-10-13 LAB — CBC
HCT: 47.1 % (ref 40.0–52.0)
Hemoglobin: 16.2 g/dL (ref 13.0–18.0)
MCH: 31.4 pg (ref 26.0–34.0)
MCHC: 34.4 g/dL (ref 32.0–36.0)
MCV: 91.2 fL (ref 80.0–100.0)
Platelets: 266 10*3/uL (ref 150–440)
RBC: 5.16 MIL/uL (ref 4.40–5.90)
RDW: 13.4 % (ref 11.5–14.5)
WBC: 10 10*3/uL (ref 3.8–10.6)

## 2015-10-13 LAB — URINALYSIS COMPLETE WITH MICROSCOPIC (ARMC ONLY)
Bacteria, UA: NONE SEEN
Bilirubin Urine: NEGATIVE
Glucose, UA: NEGATIVE mg/dL
Hgb urine dipstick: NEGATIVE
Leukocytes, UA: NEGATIVE
Nitrite: NEGATIVE
Protein, ur: 30 mg/dL — AB
RBC / HPF: NONE SEEN RBC/hpf (ref 0–5)
Specific Gravity, Urine: 1.033 — ABNORMAL HIGH (ref 1.005–1.030)
pH: 5 (ref 5.0–8.0)

## 2015-10-13 LAB — LIPASE, BLOOD: Lipase: 37 U/L (ref 11–51)

## 2015-10-13 MED ORDER — SUCRALFATE 1 G PO TABS: 1.0000 g | ORAL_TABLET | Freq: Four times a day (QID) | ORAL | Status: DC

## 2015-10-13 NOTE — Discharge Instructions (Signed)

## 2015-10-13 NOTE — ED Notes (Signed)
Pt c/o upper abd pain today.. Denies N/V/D.Marland Kitchen

## 2015-10-13 NOTE — ED Notes (Signed)
Pt reports upper abdominal pain x3 days, denies N/V/D. Pt reports pain moves around in abdomen.

## 2015-10-13 NOTE — ED Provider Notes (Signed)
[facility] Emergency Department Provider Note  ____________________________________________  Time seen: On arrival  I have reviewed the triage vital signs and the nursing notes.   HISTORY  Chief Complaint Abdominal Pain    HPI Eric Stevens is a 44 y.o. male who presents with complaints of epigastric abdominal discomfort for approximately 2 weeks. He reports it is been mostly constant. He does report a history of GERD and reports taking omeprazole 40 mg daily at bedtime. He denies fevers chills. He denies chest pain. Denies cough. Denies shortness of breath. No recent travel. He reports the pain is sometimes burning in nature and seems to be worse after eating. He denies alcohol     Past Medical History  Diagnosis Date  . Hypertension   . Prostatitis     Dr. Eliberto Ivory  . Sleep apnea     Patient Active Problem List   Diagnosis Date Noted  . Toenail fungus 05/19/2014  . Right wrist pain 04/13/2014  . Unspecified vitamin D deficiency 04/13/2014  . Shortness of breath 04/07/2014  . Diabetes mellitus, type II ([place]) 03/31/2014  . Obesity (BMI 30-39.9) 03/31/2014  . Elevated liver enzymes 10/04/2013  . Seasonal allergies 02/15/2013  . Hypertension 12/15/2012  . Erectile dysfunction 12/15/2012  . Family history of brain aneurysm 12/15/2012    History reviewed. No pertinent past surgical history.  Current Outpatient Rx  Name  Route  Sig  Dispense  Refill  . aspirin 81 MG tablet   Oral   Take 81 mg by mouth daily.         Marland Kitchen diltiazem (CARDIZEM CD) 240 MG 24 hr capsule   Oral   Take 1 capsule (240 mg total) by mouth daily.   90 capsule   3   . losartan-hydrochlorothiazide (HYZAAR) 100-12.5 MG per tablet   Oral   Take 1 tablet by mouth daily.   90 tablet   3   . omeprazole (PRILOSEC) 20 MG capsule   Oral   Take 20 mg by mouth daily.         . sucralfate (CARAFATE) 1 G tablet   Oral   Take 1 tablet (1 g total) by mouth 4 (four) times  daily.   120 tablet   1     Allergies Lisinopril and Metformin and related  Family History  Problem Relation Age of Onset  . Cancer Father     prostate  . Hypertension Father   . Diabetes Brother   . Aneurysm Mother     brain  . Hypertension Mother     Social History Social History  Substance Use Topics  . Smoking status: Current Some Day Smoker -- 5 years    Types: Cigarettes  . Smokeless tobacco: Never Used  . Alcohol Use: Yes     Comment: occassionally    Review of Systems  Constitutional: Negative for fever. Eyes: Negative for visual changes. ENT: Negative for sore throat Cardiovascular: Negative for chest pain. Respiratory: Negative for shortness of breath. Gastrointestinal: As above for abdominal discomfort Genitourinary: Negative for dysuria. Musculoskeletal: Negative for back pain. Skin: Negative for rash. Neurological: Negative for headaches or focal weakness Psychiatric: No anxiety    ____________________________________________   PHYSICAL EXAM:  VITAL SIGNS: ED Triage Vitals  Enc Vitals Group     BP 10/13/15 1708 147/93 mmHg     Pulse Rate 10/13/15 1708 96     Resp 10/13/15 1708 18     Temp 10/13/15 1708 98.6 F (37 C)  Temp Source 10/13/15 1708 Oral     SpO2 10/13/15 1708 95 %     Weight 10/13/15 1708 325 lb (147.419 kg)     Height 10/13/15 1708 6\' 2"  (1.88 m)     Head Cir --      Peak Flow --      Pain Score 10/13/15 1715 6     Pain Loc --      Pain Edu? --      Excl. in [place]? --      Constitutional: Alert and oriented. Well appearing and in no distress. Eyes: Conjunctivae are normal.  ENT   Head: Normocephalic and atraumatic.   Mouth/Throat: Mucous membranes are moist. Cardiovascular: Normal rate, regular rhythm. Normal and symmetric distal pulses are present in all extremities. No murmurs, rubs, or gallops. Respiratory: Normal respiratory effort without tachypnea nor retractions. Breath sounds are clear and equal  bilaterally.  Gastrointestinal: Soft and non-tender in all quadrants. No distention. There is no CVA tenderness. Genitourinary: deferred Musculoskeletal: Nontender with normal range of motion in all extremities. No lower extremity tenderness nor edema. Neurologic:  Normal speech and language. No gross focal neurologic deficits are appreciated. Skin:  Skin is warm, dry and intact. No rash noted. Psychiatric: Mood and affect are normal. Patient exhibits appropriate insight and judgment.  ____________________________________________    LABS (pertinent positives/negatives)  Labs Reviewed  COMPREHENSIVE METABOLIC PANEL - Abnormal; Notable for the following:    Glucose, Bld 112 (*)    Creatinine, Ser 1.29 (*)    All other components within normal limits  URINALYSIS COMPLETEWITH MICROSCOPIC ([facility] ONLY) - Abnormal; Notable for the following:    Color, Urine YELLOW (*)    APPearance CLEAR (*)    Ketones, ur TRACE (*)    Specific Gravity, Urine 1.033 (*)    Protein, ur 30 (*)    Squamous Epithelial / LPF 0-5 (*)    All other components within normal limits  LIPASE, BLOOD  CBC    ____________________________________________   EKG  ED ECG REPORT I, Lavonia Drafts, the attending physician, personally viewed and interpreted this ECG.  Date: 10/13/2015 EKG Time: 5:21 PM Rate: 92 Rhythm: normal sinus rhythm QRS Axis: normal Intervals: normal ST/T Wave abnormalities: normal Conduction Disutrbances: none Narrative Interpretation: unremarkable   ____________________________________________    RADIOLOGY I have personally reviewed any xrays that were ordered on this patient: None  ____________________________________________   PROCEDURES  Procedure(s) performed: none  Critical Care performed: none  ____________________________________________   INITIAL IMPRESSION / ASSESSMENT AND PLAN / ED COURSE  Pertinent labs & imaging results that were available during my care of  the patient were reviewed by me and considered in my medical decision making (see chart for details).  Patient with benign abdominal exam. Vitals are unremarkable. Labs are unremarkable. No chest pain Given normal lipase and no right upper quadrant tenderness and normal LFTs suspect gastritis. We will start Carafate and have the patient follow-up with gastroenterology. Return precautions discussed with patient  ____________________________________________   FINAL CLINICAL IMPRESSION(S) / ED DIAGNOSES  Final diagnoses:  Gastritis     Lavonia Drafts, MD 10/13/15 [phone]

## 2015-10-25 ENCOUNTER — Other Ambulatory Visit: Payer: Self-pay | Admitting: Internal Medicine

## 2015-10-29 ENCOUNTER — Emergency Department: Payer: BLUE CROSS/BLUE SHIELD

## 2015-10-29 ENCOUNTER — Encounter: Payer: Self-pay | Admitting: Emergency Medicine

## 2015-10-29 ENCOUNTER — Emergency Department
Admission: EM | Admit: 2015-10-29 | Discharge: 2015-10-29 | Disposition: A | Discharge status: Home / Self Care | Payer: BLUE CROSS/BLUE SHIELD | Attending: Emergency Medicine | Admitting: Emergency Medicine

## 2015-10-29 ENCOUNTER — Other Ambulatory Visit: Payer: Self-pay

## 2015-10-29 DIAGNOSIS — F17210 Nicotine dependence, cigarettes, uncomplicated: Secondary | ICD-10-CM | POA: Diagnosis not present

## 2015-10-29 DIAGNOSIS — M25512 Pain in left shoulder: Secondary | ICD-10-CM | POA: Diagnosis not present

## 2015-10-29 DIAGNOSIS — R079 Chest pain, unspecified: Secondary | ICD-10-CM

## 2015-10-29 DIAGNOSIS — M549 Dorsalgia, unspecified: Secondary | ICD-10-CM | POA: Diagnosis not present

## 2015-10-29 DIAGNOSIS — Z79899 Other long term (current) drug therapy: Secondary | ICD-10-CM | POA: Insufficient documentation

## 2015-10-29 DIAGNOSIS — E119 Type 2 diabetes mellitus without complications: Secondary | ICD-10-CM | POA: Diagnosis not present

## 2015-10-29 DIAGNOSIS — F419 Anxiety disorder, unspecified: Secondary | ICD-10-CM | POA: Diagnosis not present

## 2015-10-29 DIAGNOSIS — I10 Essential (primary) hypertension: Secondary | ICD-10-CM | POA: Diagnosis not present

## 2015-10-29 DIAGNOSIS — Z7982 Long term (current) use of aspirin: Secondary | ICD-10-CM | POA: Diagnosis not present

## 2015-10-29 LAB — BASIC METABOLIC PANEL
Anion gap: 6 (ref 5–15)
BUN: 17 mg/dL (ref 6–20)
CO2: 25 mmol/L (ref 22–32)
Calcium: 9.2 mg/dL (ref 8.9–10.3)
Chloride: 109 mmol/L (ref 101–111)
Creatinine, Ser: 1.04 mg/dL (ref 0.61–1.24)
GFR calc Af Amer: >60 mL/min (ref >60)
GFR calc non Af Amer: >60 mL/min (ref >60)
Glucose, Bld: 118 mg/dL — ABNORMAL HIGH (ref 65–99)
Potassium: 3.9 mmol/L (ref 3.5–5.1)
Sodium: 140 mmol/L (ref 135–145)

## 2015-10-29 LAB — CBC
HCT: 46.7 % (ref 40.0–52.0)
Hemoglobin: 16 g/dL (ref 13.0–18.0)
MCH: 31.2 pg (ref 26.0–34.0)
MCHC: 34.3 g/dL (ref 32.0–36.0)
MCV: 91 fL (ref 80.0–100.0)
Platelets: 234 10*3/uL (ref 150–440)
RBC: 5.13 MIL/uL (ref 4.40–5.90)
RDW: 13.2 % (ref 11.5–14.5)
WBC: 8 10*3/uL (ref 3.8–10.6)

## 2015-10-29 LAB — TROPONIN I: Troponin I: <0.03 ng/mL (ref <0.031)

## 2015-10-29 MED ORDER — OXYCODONE-ACETAMINOPHEN 5-325 MG PO TABS: 2.0000 | ORAL_TABLET | Freq: Four times a day (QID) | ORAL | Status: DC | PRN

## 2015-10-29 MED ORDER — IOHEXOL 350 MG/ML SOLN
100.0000 mL | Freq: Once | INTRAVENOUS | Status: AC | PRN
  Administered 2015-10-29: 100 mL via INTRAVENOUS

## 2015-10-29 NOTE — Discharge Instructions (Signed)
Nonspecific Chest Pain  °Chest pain can be caused by many different conditions. There is always a chance that your pain could be related to something serious, such as a heart attack or a blood clot in your lungs. Chest pain can also be caused by conditions that are not life-threatening. If you have chest pain, it is very important to follow up with your health care provider. °CAUSES  °Chest pain can be caused by: °· Heartburn. °· Pneumonia or bronchitis. °· Anxiety or stress. °· Inflammation around your heart (pericarditis) or lung (pleuritis or pleurisy). °· A blood clot in your lung. °· A collapsed lung (pneumothorax). It can develop suddenly on its own (spontaneous pneumothorax) or from trauma to the chest. °· Shingles infection (varicella-zoster virus). °· Heart attack. °· Damage to the bones, muscles, and cartilage that make up your chest wall. This can include: °¨ Bruised bones due to injury. °¨ Strained muscles or cartilage due to frequent or repeated coughing or overwork. °¨ Fracture to one or more ribs. °¨ Sore cartilage due to inflammation (costochondritis). °RISK FACTORS  °Risk factors for chest pain may include: °· Activities that increase your risk for trauma or injury to your chest. °· Respiratory infections or conditions that cause frequent coughing. °· Medical conditions or overeating that can cause heartburn. °· Heart disease or family history of heart disease. °· Conditions or health behaviors that increase your risk of developing a blood clot. °· Having had chicken pox (varicella zoster). °SIGNS AND SYMPTOMS °Chest pain can feel like: °· Burning or tingling on the surface of your chest or deep in your chest. °· Crushing, pressure, aching, or squeezing pain. °· Dull or sharp pain that is worse when you move, cough, or take a deep breath. °· Pain that is also felt in your back, neck, shoulder, or arm, or pain that spreads to any of these areas. °Your chest pain may come and go, or it may stay  constant. °DIAGNOSIS °Lab tests or other studies may be needed to find the cause of your pain. Your health care provider may have you take a test called an ambulatory ECG (electrocardiogram). An ECG records your heartbeat patterns at the time the test is performed. You may also have other tests, such as: °· Transthoracic echocardiogram (TTE). During echocardiography, sound waves are used to create a picture of all of the heart structures and to look at how blood flows through your heart. °· Transesophageal echocardiogram (TEE). This is a more advanced imaging test that obtains images from inside your body. It allows your health care provider to see your heart in finer detail. °· Cardiac monitoring. This allows your health care provider to monitor your heart rate and rhythm in real time. °· Holter monitor. This is a portable device that records your heartbeat and can help to diagnose abnormal heartbeats. It allows your health care provider to track your heart activity for several days, if needed. °· Stress tests. These can be done through exercise or by taking medicine that makes your heart beat more quickly. °· Blood tests. °· Imaging tests. °TREATMENT  °Your treatment depends on what is causing your chest pain. Treatment may include: °· Medicines. These may include: °¨ Acid blockers for heartburn. °¨ Anti-inflammatory medicine. °¨ Pain medicine for inflammatory conditions. °¨ Antibiotic medicine, if an infection is present. °¨ Medicines to dissolve blood clots. °¨ Medicines to treat coronary artery disease. °· Supportive care for conditions that do not require medicines. This may include: °¨ Resting. °¨ Applying heat   or cold packs to injured areas. °¨ Limiting activities until pain decreases. °HOME CARE INSTRUCTIONS °· If you were prescribed an antibiotic medicine, finish it all even if you start to feel better. °· Avoid any activities that bring on chest pain. °· Do not use any tobacco products, including  cigarettes, chewing tobacco, or electronic cigarettes. If you need help quitting, ask your health care provider. °· Do not drink alcohol. °· Take medicines only as directed by your health care provider. °· Keep all follow-up visits as directed by your health care provider. This is important. This includes any further testing if your chest pain does not go away. °· If heartburn is the cause for your chest pain, you may be told to keep your head raised (elevated) while sleeping. This reduces the chance that acid will go from your stomach into your esophagus. °· Make lifestyle changes as directed by your health care provider. These may include: °¨ Getting regular exercise. Ask your health care provider to suggest some activities that are safe for you. °¨ Eating a heart-healthy diet. A registered dietitian can help you to learn healthy eating options. °¨ Maintaining a healthy weight. °¨ Managing diabetes, if necessary. °¨ Reducing stress. °SEEK MEDICAL CARE IF: °· Your chest pain does not go away after treatment. °· You have a rash with blisters on your chest. °· You have a fever. °SEEK IMMEDIATE MEDICAL CARE IF:  °· Your chest pain is worse. °· You have an increasing cough, or you cough up blood. °· You have severe abdominal pain. °· You have severe weakness. °· You faint. °· You have chills. °· You have sudden, unexplained chest discomfort. °· You have sudden, unexplained discomfort in your arms, back, neck, or jaw. °· You have shortness of breath at any time. °· You suddenly start to sweat, or your skin gets clammy. °· You feel nauseous or you vomit. °· You suddenly feel light-headed or dizzy. °· Your heart begins to beat quickly, or it feels like it is skipping beats. °These symptoms may represent a serious problem that is an emergency. Do not wait to see if the symptoms will go away. Get medical help right away. Call your local emergency services (911 in the [place] Do not drive yourself to the hospital. °  °This  information is not intended to replace advice given to you by your health care provider. Make sure you discuss any questions you have with your health care provider. °  °Document Released: 08/14/2005 Document Revised: 11/25/2014 Document Reviewed: 06/10/2014 °Elsevier Interactive Patient Education ©2016 [employer] ° °

## 2015-10-29 NOTE — ED Notes (Signed)
Patient states that he has been having left arm, left sided neck pain, and left sided chest pain for the past few days. Patient denies Shortness of breath, N/V, dizziness, back pain, and LOC.

## 2015-10-29 NOTE — ED Notes (Signed)
Pt verbalized understanding of discharge instructions. NAD at this time. 

## 2015-10-29 NOTE — ED Notes (Signed)
Pt here for chest pain that radiates to left shoulder and left arm.  Started two days ago. Denies SOB

## 2015-10-29 NOTE — ED Notes (Signed)
Returned from CT.

## 2015-10-29 NOTE — ED Notes (Signed)
Patient transported to CT 

## 2015-10-29 NOTE — ED Provider Notes (Signed)
[facility] Emergency Department Provider Note  ____________________________________________  Time seen: 1:45 PM  I have reviewed the triage vital signs and the nursing notes.   HISTORY  Chief Complaint Chest Pain and Shoulder Pain    HPI Eric Stevens is a 44 y.o. male who presents with several days of mild aching in his left chest, left shoulder and left posterior back. He denies shortness of breath, no recent travel, no calf pain. No history of similar pain. No nausea no vomiting or diaphoresis. He does have a history of high blood pressure.He denies recent injury or exertion.     Past Medical History  Diagnosis Date  . Hypertension   . Prostatitis     Dr. Eliberto Ivory  . Sleep apnea     Patient Active Problem List   Diagnosis Date Noted  . Toenail fungus 05/19/2014  . Right wrist pain 04/13/2014  . Unspecified vitamin D deficiency 04/13/2014  . Shortness of breath 04/07/2014  . Diabetes mellitus, type II ([place]) 03/31/2014  . Obesity (BMI 30-39.9) 03/31/2014  . Elevated liver enzymes 10/04/2013  . Seasonal allergies 02/15/2013  . Hypertension 12/15/2012  . Erectile dysfunction 12/15/2012  . Family history of brain aneurysm 12/15/2012    No past surgical history on file.  Current Outpatient Rx  Name  Route  Sig  Dispense  Refill  . aspirin 81 MG tablet   Oral   Take 81 mg by mouth daily.         Marland Kitchen CARTIA XT 240 MG 24 hr capsule      TAKE ONE CAPSULE BY MOUTH ONCE DAILY   90 capsule   0   . losartan-hydrochlorothiazide (HYZAAR) 100-12.5 MG tablet      TAKE ONE TABLET BY MOUTH ONCE DAILY   90 tablet   0   . omeprazole (PRILOSEC) 20 MG capsule   Oral   Take 20 mg by mouth daily.         . sucralfate (CARAFATE) 1 G tablet   Oral   Take 1 tablet (1 g total) by mouth 4 (four) times daily.   120 tablet   1     Allergies Lisinopril and Metformin and related  Family History  Problem Relation Age of Onset  . Cancer Father      prostate  . Hypertension Father   . Diabetes Brother   . Aneurysm Mother     brain  . Hypertension Mother     Social History Social History  Substance Use Topics  . Smoking status: Current Some Day Smoker -- 5 years    Types: Cigarettes  . Smokeless tobacco: Never Used  . Alcohol Use: Yes     Comment: occassionally    Review of Systems  Constitutional: Negative for fever. Eyes: Negative for visual changes. ENT: Negative for sore throat Cardiovascular: As above Respiratory: Negative for shortness of breath. Gastrointestinal: Negative for abdominal pain, vomiting and diarrhea. Genitourinary: Negative for dysuria. Musculoskeletal: Negative for back pain. Skin: Negative for rash. Neurological: Negative for headaches or focal weakness Psychiatric: Mild anxiety    ____________________________________________   PHYSICAL EXAM:  VITAL SIGNS: ED Triage Vitals  Enc Vitals Group     BP 10/29/15 1132 148/92 mmHg     Pulse Rate 10/29/15 1132 80     Resp 10/29/15 1132 18     Temp 10/29/15 1132 98.3 F (36.8 C)     Temp Source 10/29/15 1132 Oral     SpO2 10/29/15 1132 97 %  Weight 10/29/15 1132 325 lb (147.419 kg)     Height --      Head Cir --      Peak Flow --      Pain Score 10/29/15 1132 5     Pain Loc --      Pain Edu? --      Excl. in [place]? --      Constitutional: Alert and oriented. Well appearing and in no distress. Eyes: Conjunctivae are normal.  ENT   Head: Normocephalic and atraumatic.   Mouth/Throat: Mucous membranes are moist. Cardiovascular: Normal rate, regular rhythm. Normal and symmetric distal pulses are present in all extremities. No murmurs, rubs, or gallops. Respiratory: Normal respiratory effort without tachypnea nor retractions. Breath sounds are clear and equal bilaterally.  Gastrointestinal: Soft and non-tender in all quadrants. No distention. There is no CVA tenderness. Genitourinary: deferred Musculoskeletal: Nontender with  normal range of motion in all extremities. No lower extremity tenderness nor edema. Neurologic:  Normal speech and language. No gross focal neurologic deficits are appreciated. Skin:  Skin is warm, dry and intact. No rash noted. Psychiatric: Mood and affect are normal. Patient exhibits appropriate insight and judgment.  ____________________________________________    LABS (pertinent positives/negatives)  Labs Reviewed  BASIC METABOLIC PANEL - Abnormal; Notable for the following:    Glucose, Bld 118 (*)    All other components within normal limits  CBC  TROPONIN I    ____________________________________________   EKG  ED ECG REPORT I, Lavonia Drafts, the attending physician, personally viewed and interpreted this ECG.  Date: 10/29/2015 EKG Time: 11:30 AM Rate: 73 Rhythm: normal sinus rhythm QRS Axis: normal Intervals: normal ST/T Wave abnormalities: normal Conduction Disutrbances: none Narrative Interpretation: unremarkable   ____________________________________________    RADIOLOGY I have personally reviewed any xrays that were ordered on this patient: Chest x-ray unremarkable CT angio pending ____________________________________________   PROCEDURES  Procedure(s) performed: none  Critical Care performed: none  ____________________________________________   INITIAL IMPRESSION / ASSESSMENT AND PLAN / ED COURSE  Pertinent labs & imaging results that were available during my care of the patient were reviewed by me and considered in my medical decision making (see chart for details).  Patient presents with mild left chest pain with radiation to left shoulder and back. Labs unremarkable, EKG is normal. Overall he is well-appearing. We will obtain CT angiography to rule out aortic dissection.  Dr. Jimmye Norman will follow up on the CT and if normal patient appropriate for discharge with Cardiology follow  up  ____________________________________________   FINAL CLINICAL IMPRESSION(S) / ED DIAGNOSES  Final diagnoses:  Chest pain, unspecified chest pain type     Lavonia Drafts, MD 10/29/15 1454

## 2016-01-04 LAB — HM DIABETES EYE EXAM

## 2016-01-19 ENCOUNTER — Encounter: Payer: Self-pay | Admitting: Internal Medicine

## 2016-01-31 ENCOUNTER — Ambulatory Visit (INDEPENDENT_AMBULATORY_CARE_PROVIDER_SITE_OTHER): Payer: BLUE CROSS/BLUE SHIELD | Admitting: Internal Medicine

## 2016-01-31 ENCOUNTER — Encounter: Payer: Self-pay | Admitting: Internal Medicine

## 2016-01-31 VITALS — BP 124/83 | HR 100 | Temp 98.8°F | Ht 74.5 in | Wt 332.0 lb

## 2016-01-31 DIAGNOSIS — J069 Acute upper respiratory infection, unspecified: Secondary | ICD-10-CM

## 2016-01-31 DIAGNOSIS — R0789 Other chest pain: Secondary | ICD-10-CM | POA: Insufficient documentation

## 2016-01-31 DIAGNOSIS — R079 Chest pain, unspecified: Secondary | ICD-10-CM | POA: Diagnosis not present

## 2016-01-31 DIAGNOSIS — B9789 Other viral agents as the cause of diseases classified elsewhere: Secondary | ICD-10-CM

## 2016-01-31 DIAGNOSIS — E119 Type 2 diabetes mellitus without complications: Secondary | ICD-10-CM | POA: Diagnosis not present

## 2016-01-31 DIAGNOSIS — I10 Essential (primary) hypertension: Secondary | ICD-10-CM | POA: Diagnosis not present

## 2016-01-31 MED ORDER — LOSARTAN POTASSIUM-HCTZ 100-12.5 MG PO TABS: 1.0000 | ORAL_TABLET | Freq: Every day | ORAL | Status: DC

## 2016-01-31 MED ORDER — GLUCOSE BLOOD VI STRP: 1.0000 | ORAL_STRIP | Status: DC | PRN

## 2016-01-31 MED ORDER — GLUCOSE BLOOD VI STRP: ORAL_STRIP | Status: DC

## 2016-01-31 MED ORDER — HYDROCODONE-HOMATROPINE 5-1.5 MG/5ML PO SYRP: 5.0000 mL | ORAL_SOLUTION | Freq: Three times a day (TID) | ORAL | Status: DC | PRN

## 2016-01-31 MED ORDER — DILTIAZEM HCL ER COATED BEADS 240 MG PO CP24: 240.0000 mg | ORAL_CAPSULE | Freq: Every day | ORAL | Status: DC

## 2016-01-31 NOTE — Assessment & Plan Note (Signed)
Symptoms and exam c/w viral URI with cough. Supportive care, rest. Add Hycodan as needed for cough. Discussed risks of this medication.

## 2016-01-31 NOTE — Progress Notes (Signed)
Pre visit review using our clinic review tool, if applicable. No additional management support is needed unless otherwise documented below in the visit note. 

## 2016-01-31 NOTE — Assessment & Plan Note (Signed)
Will check A1c with labs. Gave him a new glucometer and called in test strips.

## 2016-01-31 NOTE — Assessment & Plan Note (Signed)
BP Readings from Last 3 Encounters:  01/31/16 124/83  10/29/15 127/79  10/13/15 147/93   BP well controlled. Renal function with labs. Continue Diltiazem and Losartan-HCTZ.

## 2016-01-31 NOTE — Progress Notes (Signed)
Subjective:    Patient ID: RAGE NABB, male    DOB: Jun 30, 1971, 45 y.o.   MRN: [id number]  HPI  45YO male presents for follow up. Last seen by me in 2015.  Left sided chest pain - Intermittent over last few months. No dyspnea or LEE. Occurs mostly with lying on left side, but occasionally at work when exerting himself. He denies shortness of breath. In the ER, EKG and cardiac markers were normal. CT chest was normal. He has not recently followed up with his cardiologist, Dr. Fletcher Anon.  DM - He has not been checking his BG.   HTN - BP has been well controlled. He is compliant with medication.  Last few days, he has had nasal congestion and cough. No fever, chills. No dyspnea. Took Corcidin with minimal improvement.    Wt Readings from Last 3 Encounters:  01/31/16 332 lb (150.594 kg)  10/29/15 325 lb (147.419 kg)  10/13/15 325 lb (147.419 kg)   BP Readings from Last 3 Encounters:  01/31/16 124/83  10/29/15 127/79  10/13/15 147/93    Past Medical History  Diagnosis Date  . Hypertension   . Prostatitis     Dr. Eliberto Ivory  . Sleep apnea    Family History  Problem Relation Age of Onset  . Hypertension Father   . Cancer Father     prostate, brain  . Diabetes Brother   . Aneurysm Mother     brain  . Hypertension Mother    History reviewed. No pertinent past surgical history. Social History   Social History  . Marital Status: Married    Spouse Name: N/A  . Number of Children: N/A  . Years of Education: N/A   Social History Main Topics  . Smoking status: Current Some Day Smoker -- 5 years    Types: Cigarettes  . Smokeless tobacco: Never Used  . Alcohol Use: Yes     Comment: occassionally  . Drug Use: No  . Sexual Activity: Not Asked   Other Topics Concern  . None   Social History Narrative   Lives with wife in [place]. Children, Amelia and 14YO.      Work - Biomedical scientist truck      Diet - regular, limited red meat      Exercise - occasionally     Review of Systems  Constitutional: Positive for fatigue. Negative for fever, chills, activity change, appetite change and unexpected weight change.  HENT: Positive for congestion, postnasal drip and rhinorrhea. Negative for sinus pressure, sore throat, trouble swallowing and voice change.   Eyes: Negative for visual disturbance.  Respiratory: Positive for cough. Negative for chest tightness and shortness of breath.   Cardiovascular: Positive for chest pain. Negative for palpitations and leg swelling.  Gastrointestinal: Negative for abdominal pain, diarrhea, constipation and abdominal distention.  Genitourinary: Negative for dysuria, urgency and difficulty urinating.  Musculoskeletal: Negative for arthralgias and gait problem.  Skin: Negative for color change and rash.  Hematological: Negative for adenopathy.  Psychiatric/Behavioral: Negative for sleep disturbance and dysphoric mood. The patient is not nervous/anxious.        Objective:    BP 124/83 mmHg  Pulse 100  Temp(Src) 98.8 F (37.1 C) (Oral)  Ht 6' 2.5" (1.892 m)  Wt 332 lb (150.594 kg)  BMI 42.07 kg/m2  SpO2 96% Physical Exam  Constitutional: He is oriented to person, place, and time. He appears well-developed and well-nourished. No distress.  HENT:  Head: Normocephalic and atraumatic.  Right Ear:  Tympanic membrane and external ear normal.  Left Ear: Tympanic membrane and external ear normal.  Nose: Mucosal edema and rhinorrhea present.  Mouth/Throat: Oropharynx is clear and moist. No oropharyngeal exudate.  Eyes: Conjunctivae and EOM are normal. Pupils are equal, round, and reactive to light. Right eye exhibits no discharge. Left eye exhibits no discharge. No scleral icterus.  Neck: Normal range of motion. Neck supple. No tracheal deviation present. No thyromegaly present.  Cardiovascular: Normal rate, regular rhythm and normal heart sounds.  Exam reveals no gallop and no friction rub.   No murmur  heard. Pulmonary/Chest: Effort normal and breath sounds normal. No accessory muscle usage. No tachypnea. No respiratory distress. He has no decreased breath sounds. He has no wheezes. He has no rhonchi. He has no rales. He exhibits no tenderness.  Musculoskeletal: Normal range of motion. He exhibits no edema.  Lymphadenopathy:    He has no cervical adenopathy.  Neurological: He is alert and oriented to person, place, and time. No cranial nerve deficit. Coordination normal.  Skin: Skin is warm and dry. No rash noted. He is not diaphoretic. No erythema. No pallor.  Psychiatric: He has a normal mood and affect. His behavior is normal. Judgment and thought content normal.          Assessment & Plan:   Problem List Items Addressed This Visit      Unprioritized   Diabetes mellitus, type II ([place]) - Primary    Will check A1c with labs. Gave him a new glucometer and called in test strips.      Relevant Medications   losartan-hydrochlorothiazide (HYZAAR) 100-12.5 MG tablet   Other Relevant Orders   Comprehensive metabolic panel   Hemoglobin A1c   Microalbumin / creatinine urine ratio   Lipid panel   Hypertension    BP Readings from Last 3 Encounters:  01/31/16 124/83  10/29/15 127/79  10/13/15 147/93   BP well controlled. Renal function with labs. Continue Diltiazem and Losartan-HCTZ.      Relevant Medications   losartan-hydrochlorothiazide (HYZAAR) 100-12.5 MG tablet   diltiazem (CARTIA XT) 240 MG 24 hr capsule   Pain in the chest    Chronic left sided chest pain. Description sounds muscular in nature, but he does have risk of CAD. Reviewed ER evaluation including EKG and cardiac markers. Will set up follow up with his cardiologist.      Relevant Orders   Ambulatory referral to Cardiology   Viral URI with cough    Symptoms and exam c/w viral URI with cough. Supportive care, rest. Add Hycodan as needed for cough. Discussed risks of this medication.          Return in  about 1 week (around 02/07/2016) for Recheck.  Ronette Deter, MD [facility]

## 2016-01-31 NOTE — Assessment & Plan Note (Signed)
Chronic left sided chest pain. Description sounds muscular in nature, but he does have risk of CAD. Reviewed ER evaluation including EKG and cardiac markers. Will set up follow up with his cardiologist.

## 2016-01-31 NOTE — Patient Instructions (Signed)
Labs today.  Use Hycodan as needed for cough.  We will set up evaluation with cardiology.  Follow up 1 week.

## 2016-02-01 LAB — COMPREHENSIVE METABOLIC PANEL
ALT: 59 U/L — ABNORMAL HIGH (ref 0–53)
AST: 36 U/L (ref 0–37)
Albumin: 4 g/dL (ref 3.5–5.2)
Alkaline Phosphatase: 67 U/L (ref 39–117)
BUN: 14 mg/dL (ref 6–23)
CO2: 27 mEq/L (ref 19–32)
Calcium: 9.6 mg/dL (ref 8.4–10.5)
Chloride: 101 mEq/L (ref 96–112)
Creatinine, Ser: 1.16 mg/dL (ref 0.40–1.50)
GFR: 88.25 mL/min (ref >60.00)
Glucose, Bld: 151 mg/dL — ABNORMAL HIGH (ref 70–99)
Potassium: 4.1 mEq/L (ref 3.5–5.1)
Sodium: 136 mEq/L (ref 135–145)
Total Bilirubin: 0.4 mg/dL (ref 0.2–1.2)
Total Protein: 7.3 g/dL (ref 6.0–8.3)

## 2016-02-01 LAB — MICROALBUMIN / CREATININE URINE RATIO
Creatinine,U: 174.4 mg/dL
Microalb Creat Ratio: 1.5 mg/g (ref 0.0–30.0)
Microalb, Ur: 2.6 mg/dL — ABNORMAL HIGH (ref 0.0–1.9)

## 2016-02-01 LAB — LIPID PANEL
Cholesterol: 152 mg/dL (ref 0–200)
HDL: 30.2 mg/dL — ABNORMAL LOW (ref >39.00)
LDL Cholesterol: 88 mg/dL (ref 0–99)
NonHDL: 121.94
Total CHOL/HDL Ratio: 5
Triglycerides: 171 mg/dL — ABNORMAL HIGH (ref 0.0–149.0)
VLDL: 34.2 mg/dL (ref 0.0–40.0)

## 2016-02-01 LAB — HEMOGLOBIN A1C: Hgb A1c MFr Bld: 6.8 % — ABNORMAL HIGH (ref 4.6–6.5)

## 2016-02-06 ENCOUNTER — Ambulatory Visit: Payer: BLUE CROSS/BLUE SHIELD | Admitting: Internal Medicine

## 2016-02-07 ENCOUNTER — Ambulatory Visit: Payer: BLUE CROSS/BLUE SHIELD | Admitting: Internal Medicine

## 2016-03-18 ENCOUNTER — Ambulatory Visit: Payer: BLUE CROSS/BLUE SHIELD | Admitting: Cardiovascular Disease

## 2016-03-31 ENCOUNTER — Other Ambulatory Visit: Payer: Self-pay | Admitting: Internal Medicine

## 2016-04-01 ENCOUNTER — Other Ambulatory Visit: Payer: Self-pay | Admitting: Internal Medicine

## 2016-04-02 ENCOUNTER — Ambulatory Visit (INDEPENDENT_AMBULATORY_CARE_PROVIDER_SITE_OTHER): Payer: BLUE CROSS/BLUE SHIELD | Admitting: Cardiovascular Disease

## 2016-04-02 ENCOUNTER — Encounter: Payer: Self-pay | Admitting: Cardiovascular Disease

## 2016-04-02 VITALS — BP 140/88 | HR 97 | Ht 75.0 in | Wt 335.0 lb

## 2016-04-02 DIAGNOSIS — I499 Cardiac arrhythmia, unspecified: Secondary | ICD-10-CM | POA: Diagnosis not present

## 2016-04-02 DIAGNOSIS — R079 Chest pain, unspecified: Secondary | ICD-10-CM

## 2016-04-02 NOTE — Progress Notes (Signed)
Cardiology Office Note   Date:  04/02/2016   ID:  Eric Stevens, DOB Mar 01, 1971, MRN [id number]  PCP:  Rica Mast, MD  Cardiologist:   Kathlyn Sacramento, MD   Chief Complaint  Patient presents with  . other    Ref by Dr. Gilford Rile for chest pain. Meds reviewed by the patient verbally. Pt. c/o chest pain and irreg. heart beats;  symptoms off & on for about 1 year.       History of Present Illness: Eric Stevens is a 45 y.o. male who  was referred by Dr. Gilford Rile for evaluation of chest pain. He was seen by me in 2015 for an abnormal EKG. He has known history of hypertension, obesity and  sleep apnea.  He does have family history of coronary artery disease. He is a smoker and works as a Administrator. He had an echocardiogram in June 2016 which was normal. He reports intermittent episodes of chest pain over the last year. He went to the emergency room in November and was diagnosed with gastritis. He went again to the [facility] in December 2016 with atypical chest pain. Cardiac enzymes were negative. CT of the chest showed no evidence of pulmonary embolism. He describes intermittent episodes of substernal aching sensation in his chest with no radiation. This typically happens at rest and not with physical activities and usually is mild and can last a whole day. It feels like a pulled muscle. He has mild exertional dyspnea with no orthopnea, PND or leg edema.    Past Medical History  Diagnosis Date  . Hypertension   . Prostatitis     Dr. Eliberto Ivory  . Sleep apnea     History reviewed. No pertinent past surgical history.   Current Outpatient Prescriptions  Medication Sig Dispense Refill  . diltiazem (CARTIA XT) 240 MG 24 hr capsule Take 1 capsule (240 mg total) by mouth daily. 90 capsule 3  . glucose blood test strip Use as instructed with One Touch Verio to test blood sugars twice daily. Dx: E11.9 100 each 5  . HYDROcodone-homatropine (HYCODAN) 5-1.5 MG/5ML syrup Take 5 mLs by mouth  every 8 (eight) hours as needed for cough. 120 mL 0  . losartan-hydrochlorothiazide (HYZAAR) 100-12.5 MG tablet Take 1 tablet by mouth daily. 90 tablet 3  . omeprazole (PRILOSEC) 20 MG capsule Take 20 mg by mouth daily.     No current facility-administered medications for this visit.    Allergies:   Lisinopril and Metformin and related    Social History:  The patient  reports that he has been smoking Cigarettes.  He has smoked for the past 5 years. He has never used smokeless tobacco. He reports that he drinks alcohol. He reports that he does not use illicit drugs.   Family History:  The patient's family history includes Aneurysm in his mother; Cancer in his father; Diabetes in his brother; Hypertension in his father and mother.    ROS:  Please see the history of present illness.   Otherwise, review of systems are positive for none.   All other systems are reviewed and negative.    PHYSICAL EXAM: VS:  BP 140/88 mmHg  Pulse 97  Ht 6\' 3"  (1.905 m)  Wt 335 lb (151.955 kg)  BMI 41.87 kg/m2 , BMI Body mass index is 41.87 kg/(m^2). GEN: Well nourished, well developed, in no acute distress HEENT: normal Neck: no JVD, carotid bruits, or masses Cardiac: RRR; no murmurs, rubs, or gallops,no edema  Respiratory:  clear to auscultation bilaterally, normal work of breathing GI: soft, nontender, nondistended, + BS MS: no deformity or atrophy Skin: warm and dry, no rash Neuro:  Strength and sensation are intact Psych: euthymic mood, full affect   EKG:  EKG is ordered today. The ekg ordered today demonstrates normal sinus rhythm with no significant ST or T wave changes.   Recent Labs: 10/29/2015: Hemoglobin 16.0; Platelets 234 01/31/2016: ALT 59*; BUN 14; Creatinine, Ser 1.16; Potassium 4.1; Sodium 136    Lipid Panel    Component Value Date/Time   CHOL 152 01/31/2016 1612   CHOL 152 04/05/2014 0807   TRIG 171.0* 01/31/2016 1612   HDL 30.20* 01/31/2016 1612   HDL 32* 04/05/2014 0807     CHOLHDL 5 01/31/2016 1612   VLDL 34.2 01/31/2016 1612   LDLCALC 88 01/31/2016 1612   LDLCALC 96 04/05/2014 0807      Wt Readings from Last 3 Encounters:  04/02/16 335 lb (151.955 kg)  01/31/16 332 lb (150.594 kg)  10/29/15 325 lb (147.419 kg)       ASSESSMENT AND PLAN:  1.  Atypical chest pain: The chest pain is overall atypical in quality and I agree that it's most likely musculoskeletal. However, he has associated exertional dyspnea and multiple risk factors for coronary artery disease. Thus, I recommend a treadmill stress test for evaluation. Continue treatment of risk factors.  2. Essential hypertension: Blood pressure is mildly elevated but he did not take his medications today.     Disposition:   FU with me prn.  Signed,  Kathlyn Sacramento, MD  04/02/2016 4:38 PM    [place]

## 2016-04-02 NOTE — Patient Instructions (Addendum)
Medication Instructions:  Please continue your current medications  Labwork: None  Testing/Procedures: Your physician has requested that you have an exercise tolerance test.   Please take your losartan & HCTZ the morning of your test, but DO NOT TAKE DILTIAZEM that am  Date & time:______________________  Follow-Up: Call or return to clinic prn if these symptoms worsen or fail to improve as anticipated.  If you need a refill on your cardiac medications before your next appointment, please call your pharmacy.  Exercise Stress Electrocardiogram An exercise stress electrocardiogram is a test that is done to evaluate the blood supply to your heart. This test may also be called exercise stress electrocardiography. The test is done while you are walking on a treadmill. The goal of this test is to raise your heart rate. This test is done to find areas of poor blood flow to the heart by determining the extent of coronary artery disease (CAD).   CAD is defined as narrowing in one or more heart (coronary) arteries of more than 70%. If you have an abnormal test result, this may mean that you are not getting adequate blood flow to your heart during exercise. Additional testing may be needed to understand why your test was abnormal. LET [facility] CARE PROVIDER KNOW ABOUT:   Any allergies you have.  All medicines you are taking, including vitamins, herbs, eye drops, creams, and over-the-counter medicines.  Previous problems you or members of your family have had with the use of anesthetics.  Any blood disorders you have.  Previous surgeries you have had.  Medical conditions you have.  Possibility of pregnancy, if this applies. RISKS AND COMPLICATIONS Generally, this is a safe procedure. However, as with any procedure, complications can occur. Possible complications can include:  Pain or pressure in the following areas:  Chest.  Jaw or neck.  Between your shoulder blades.  Radiating  down your left arm.  Dizziness or light-headedness.  Shortness of breath.  Increased or irregular heartbeats.  Nausea or vomiting.  Heart attack (rare). BEFORE THE PROCEDURE  Avoid all forms of caffeine 24 hours before your test or as directed by your health care provider. This includes coffee, tea (even decaffeinated tea), caffeinated sodas, chocolate, cocoa, and certain pain medicines.  Follow your health care provider's instructions regarding eating and drinking before the test.  Take your medicines as directed at regular times with water unless instructed otherwise. Exceptions may include:  If you have diabetes, ask how you are to take your insulin or pills. It is common to adjust insulin dosing the morning of the test.  If you are taking beta-blocker medicines, it is important to talk to your health care provider about these medicines well before the date of your test. Taking beta-blocker medicines may interfere with the test. In some cases, these medicines need to be changed or stopped 24 hours or more before the test.  If you wear a nitroglycerin patch, it may need to be removed prior to the test. Ask your health care provider if the patch should be removed before the test.  If you use an inhaler for any breathing condition, bring it with you to the test.  If you are an outpatient, bring a snack so you can eat right after the stress phase of the test.  Do not smoke for 4 hours prior to the test or as directed by your health care provider.  Do not apply lotions, powders, creams, or oils on your chest prior to the  test.  Wear loose-fitting clothes and comfortable shoes for the test. This test involves walking on a treadmill. PROCEDURE  Multiple patches (electrodes) will be put on your chest. If needed, small areas of your chest may have to be shaved to get better contact with the electrodes. Once the electrodes are attached to your body, multiple wires will be attached to the  electrodes and your heart rate will be monitored.  Your heart will be monitored both at rest and while exercising.  You will walk on a treadmill. The treadmill will be started at a slow pace. The treadmill speed and incline will gradually be increased to raise your heart rate. AFTER THE PROCEDURE  Your heart rate and blood pressure will be monitored after the test.  You may return to your normal schedule including diet, activities, and medicines, unless your health care provider tells you otherwise.   This information is not intended to replace advice given to you by your health care provider. Make sure you discuss any questions you have with your health care provider.   Document Released: 11/01/2000 Document Revised: 11/09/2013 Document Reviewed: 07/12/2013 Elsevier Interactive Patient Education [employer].

## 2016-04-03 ENCOUNTER — Ambulatory Visit: Payer: BLUE CROSS/BLUE SHIELD | Admitting: Cardiology

## 2016-04-08 ENCOUNTER — Ambulatory Visit (INDEPENDENT_AMBULATORY_CARE_PROVIDER_SITE_OTHER): Payer: BLUE CROSS/BLUE SHIELD

## 2016-04-08 DIAGNOSIS — I499 Cardiac arrhythmia, unspecified: Secondary | ICD-10-CM

## 2016-04-08 DIAGNOSIS — R079 Chest pain, unspecified: Secondary | ICD-10-CM | POA: Diagnosis not present

## 2016-04-10 LAB — EXERCISE TOLERANCE TEST
Estimated workload: 11.9 METS
Exercise duration (min): 10 min
Exercise duration (sec): 0 s
MPHR: 177 {beats}/min
Peak HR: 160 {beats}/min
Percent HR: 91 %
Percent of predicted max HR: 90 %
RPE: 16
Rest HR: 97 {beats}/min
Stage 1 DBP: 101 mmHg
Stage 1 Grade: 0 %
Stage 1 HR: 96 {beats}/min
Stage 1 SBP: 139 mmHg
Stage 1 Speed: 0 mph
Stage 10 Grade: 0 %
Stage 10 HR: 108 {beats}/min
Stage 10 Speed: 0 mph
Stage 2 Grade: 0 %
Stage 2 HR: 96 {beats}/min
Stage 2 Speed: 0 mph
Stage 3 Grade: 0 %
Stage 3 HR: 102 {beats}/min
Stage 3 Speed: 1 mph
Stage 4 Grade: 0 %
Stage 4 HR: 101 {beats}/min
Stage 4 Speed: 1 mph
Stage 5 Grade: 10 %
Stage 5 HR: 118 {beats}/min
Stage 5 Speed: 1.7 mph
Stage 6 DBP: 78 mmHg
Stage 6 Grade: 12 %
Stage 6 HR: 136 {beats}/min
Stage 6 SBP: 196 mmHg
Stage 6 Speed: 2.5 mph
Stage 7 DBP: 70 mmHg
Stage 7 Grade: 14 %
Stage 7 HR: 150 {beats}/min
Stage 7 SBP: 194 mmHg
Stage 7 Speed: 3.4 mph
Stage 8 Grade: 16 %
Stage 8 HR: 160 {beats}/min
Stage 8 Speed: 4.3 mph
Stage 9 Grade: 0 %
Stage 9 HR: 144 {beats}/min
Stage 9 Speed: 0 mph

## 2016-04-21 DIAGNOSIS — J029 Acute pharyngitis, unspecified: Secondary | ICD-10-CM | POA: Diagnosis not present

## 2016-04-21 DIAGNOSIS — H6121 Impacted cerumen, right ear: Secondary | ICD-10-CM | POA: Diagnosis not present

## 2016-04-21 DIAGNOSIS — F17200 Nicotine dependence, unspecified, uncomplicated: Secondary | ICD-10-CM | POA: Diagnosis not present

## 2016-04-23 ENCOUNTER — Ambulatory Visit: Payer: BLUE CROSS/BLUE SHIELD | Admitting: Internal Medicine

## 2016-04-29 ENCOUNTER — Ambulatory Visit: Payer: BLUE CROSS/BLUE SHIELD | Admitting: Internal Medicine

## 2016-09-22 ENCOUNTER — Encounter: Payer: Self-pay | Admitting: *Deleted

## 2016-09-22 ENCOUNTER — Emergency Department
Admission: EM | Admit: 2016-09-22 | Discharge: 2016-09-22 | Disposition: A | Discharge status: Home / Self Care | Payer: BLUE CROSS/BLUE SHIELD | Attending: Emergency Medicine | Admitting: Emergency Medicine

## 2016-09-22 DIAGNOSIS — I10 Essential (primary) hypertension: Secondary | ICD-10-CM

## 2016-09-22 DIAGNOSIS — E1165 Type 2 diabetes mellitus with hyperglycemia: Secondary | ICD-10-CM | POA: Insufficient documentation

## 2016-09-22 DIAGNOSIS — R739 Hyperglycemia, unspecified: Secondary | ICD-10-CM

## 2016-09-22 DIAGNOSIS — F17210 Nicotine dependence, cigarettes, uncomplicated: Secondary | ICD-10-CM | POA: Diagnosis not present

## 2016-09-22 DIAGNOSIS — R1012 Left upper quadrant pain: Secondary | ICD-10-CM

## 2016-09-22 DIAGNOSIS — Z79899 Other long term (current) drug therapy: Secondary | ICD-10-CM | POA: Diagnosis not present

## 2016-09-22 LAB — COMPREHENSIVE METABOLIC PANEL
ALT: 49 U/L (ref 17–63)
AST: 35 U/L (ref 15–41)
Albumin: 3.8 g/dL (ref 3.5–5.0)
Alkaline Phosphatase: 62 U/L (ref 38–126)
Anion gap: 9 (ref 5–15)
BUN: 13 mg/dL (ref 6–20)
CO2: 23 mmol/L (ref 22–32)
Calcium: 9.1 mg/dL (ref 8.9–10.3)
Chloride: 107 mmol/L (ref 101–111)
Creatinine, Ser: 1 mg/dL (ref 0.61–1.24)
GFR calc Af Amer: >60 mL/min (ref >60)
GFR calc non Af Amer: >60 mL/min (ref >60)
Glucose, Bld: 131 mg/dL — ABNORMAL HIGH (ref 65–99)
Potassium: 4 mmol/L (ref 3.5–5.1)
Sodium: 139 mmol/L (ref 135–145)
Total Bilirubin: 0.5 mg/dL (ref 0.3–1.2)
Total Protein: 7.2 g/dL (ref 6.5–8.1)

## 2016-09-22 LAB — URINALYSIS COMPLETE WITH MICROSCOPIC (ARMC ONLY)
Bacteria, UA: NONE SEEN
Bilirubin Urine: NEGATIVE
Glucose, UA: NEGATIVE mg/dL
Hgb urine dipstick: NEGATIVE
Ketones, ur: NEGATIVE mg/dL
Leukocytes, UA: NEGATIVE
Nitrite: NEGATIVE
Protein, ur: NEGATIVE mg/dL
Specific Gravity, Urine: 1.02 (ref 1.005–1.030)
pH: 5 (ref 5.0–8.0)

## 2016-09-22 LAB — CBC
HCT: 48.5 % (ref 40.0–52.0)
Hemoglobin: 16.7 g/dL (ref 13.0–18.0)
MCH: 31.5 pg (ref 26.0–34.0)
MCHC: 34.4 g/dL (ref 32.0–36.0)
MCV: 91.7 fL (ref 80.0–100.0)
Platelets: 264 10*3/uL (ref 150–440)
RBC: 5.29 MIL/uL (ref 4.40–5.90)
RDW: 13.4 % (ref 11.5–14.5)
WBC: 7.3 10*3/uL (ref 3.8–10.6)

## 2016-09-22 LAB — LIPASE, BLOOD: Lipase: 28 U/L (ref 11–51)

## 2016-09-22 MED ORDER — KETOROLAC TROMETHAMINE 60 MG/2ML IM SOLN
60.0000 mg | Freq: Once | INTRAMUSCULAR | Status: AC
  Administered 2016-09-22: 60 mg via INTRAMUSCULAR
  Filled 2016-09-22: qty 2

## 2016-09-22 MED ORDER — METOPROLOL TARTRATE 25 MG PO TABS
25.0000 mg | ORAL_TABLET | Freq: Once | ORAL | Status: AC
  Administered 2016-09-22: 25 mg via ORAL
  Filled 2016-09-22: qty 1

## 2016-09-22 NOTE — ED Triage Notes (Signed)
Pt reports LUQ pain radiating to back for the last 4 days, pt denies any other symptoms

## 2016-09-22 NOTE — Discharge Instructions (Signed)
You may take Motrin or Tylenol for your abdominal pain. Please take all your blood pressure medications as prescribed when you get home. Please talk to your regular doctor about your elevated blood sugar; your doctor may want to do a test when your fasting.  Return to the emergency department if you develop severe pain, fever, lightheadedness or fainting, inability to keep down fluids, or any other symptoms concerning to you.

## 2016-09-22 NOTE — ED Notes (Signed)
ED Provider at bedside. 

## 2016-09-22 NOTE — ED Provider Notes (Signed)
[facility] Emergency Department Provider Note  ____________________________________________  Time seen: Approximately 10:10 AM  I have reviewed the triage vital signs and the nursing notes.   HISTORY  Chief Complaint Abdominal Pain    HPI COBURN LENGER is a 45 y.o. male with a history of HTN presenting with 5-7 days of left upper quadrant pain. The patient reports the pain that is worse when he crunches forward to get up and eases off once he walks for a little while. He denies any associated nausea vomiting or diarrhea, no fevers or chills. No urinary symptoms. No chest pain, shortness of breath, palpitations, lightheadedness or syncope. No recent changes in his activity or trauma.   Past Medical History:  Diagnosis Date  . Hypertension   . Prostatitis    Dr. Eliberto Ivory  . Sleep apnea     Patient Active Problem List   Diagnosis Date Noted  . Pain in the chest 01/31/2016  . Viral URI with cough 01/31/2016  . Unspecified vitamin D deficiency 04/13/2014  . Diabetes mellitus, type II ([place]) 03/31/2014  . Obesity (BMI 30-39.9) 03/31/2014  . Elevated liver enzymes 10/04/2013  . Seasonal allergies 02/15/2013  . Hypertension 12/15/2012  . Erectile dysfunction 12/15/2012  . Family history of brain aneurysm 12/15/2012    History reviewed. No pertinent surgical history.  Current Outpatient Rx  . Order #: [id number] Class: Normal  . Order #: [id number] Class: Normal  . Order #: [id number] Class: Print  . Order #: [id number] Class: Normal  . Order #: [id number] Class: Historical Med    Allergies Lisinopril and Metformin and related  Family History  Problem Relation Age of Onset  . Hypertension Father   . Cancer Father     prostate, brain  . Diabetes Brother   . Aneurysm Mother     brain  . Hypertension Mother     Social History Social History  Substance Use Topics  . Smoking status: Current Some Day Smoker    Years: 5.00    Types: Cigarettes  .  Smokeless tobacco: Never Used  . Alcohol use Yes     Comment: occassionally    Review of Systems Constitutional: No fever/chills. No lightheadedness or syncope. Eyes: No visual changes. ENT: No sore throat. No congestion or rhinorrhea. Cardiovascular: Denies chest pain. Denies palpitations. Respiratory: Denies shortness of breath.  No cough. Gastrointestinal: Positive left upper quadrant abdominal pain.  No nausea, no vomiting.  No diarrhea.  No constipation. Genitourinary: Negative for dysuria. Musculoskeletal: Negative for back pain. Skin: Negative for rash. Neurological: Negative for headaches. No focal numbness, tingling or weakness.   10-point ROS otherwise negative.  ____________________________________________   PHYSICAL EXAM:  VITAL SIGNS: ED Triage Vitals  Enc Vitals Group     BP 09/22/16 0930 (!) 142/103     Pulse Rate 09/22/16 0930 72     Resp --      Temp 09/22/16 0948 98 F (36.7 C)     Temp Source 09/22/16 0948 Oral     SpO2 09/22/16 0930 98 %     Weight 09/22/16 0918 (!) 325 lb (147.4 kg)     Height 09/22/16 0918 6\' 3"  (1.905 m)     Head Circumference --      Peak Flow --      Pain Score 09/22/16 0918 4     Pain Loc --      Pain Edu? --      Excl. in [place]? --     Constitutional: Alert and oriented. Chronically  ill appearing but in no acute distress. Answers questions appropriately. Eyes: Conjunctivae are normal.  EOMI. No scleral icterus. Head: Atraumatic. Nose: No congestion/rhinnorhea. Mouth/Throat: Mucous membranes are moist.  Neck: No stridor.  Supple.  No meningismus. Cardiovascular: Normal rate, regular rhythm. No murmurs, rubs or gallops.  Respiratory: Normal respiratory effort.  No accessory muscle use or retractions. Lungs CTAB.  No wheezes, rales or ronchi. Gastrointestinal: Morbidly obese. Soft, nontender and nondistended.  I am unable to reproduce the patient's tenderness on palpation. No guarding or rebound.  No peritoneal  signs. Musculoskeletal: No LE edema.  Neurologic:  A&Ox3.  Speech is clear.  Face and smile are symmetric.  EOMI.  Moves all extremities well. Skin:  Skin is warm, dry and intact. No rash noted. Psychiatric: Mood and affect are normal. Speech and behavior are normal.  Normal judgement.  ____________________________________________   LABS (all labs ordered are listed, but only abnormal results are displayed)  Labs Reviewed  COMPREHENSIVE METABOLIC PANEL - Abnormal; Notable for the following:       Result Value   Glucose, Bld 131 (*)    All other components within normal limits  URINALYSIS COMPLETEWITH MICROSCOPIC ([facility] ONLY) - Abnormal; Notable for the following:    Color, Urine YELLOW (*)    APPearance CLEAR (*)    Squamous Epithelial / LPF 0-5 (*)    All other components within normal limits  CBC  LIPASE, BLOOD   ____________________________________________  EKG  Not indicated.  ____________________________________________  RADIOLOGY  No results found.  ____________________________________________   PROCEDURES  Procedure(s) performed: None  Procedures  Critical Care performed: No ____________________________________________   INITIAL IMPRESSION / ASSESSMENT AND PLAN / ED COURSE  Pertinent labs & imaging results that were available during my care of the patient were reviewed by me and considered in my medical decision making (see chart for details).  45 y.o. male with a history of HTN presenting with 5-7 days of isolated left upper quadrant pain that is positional. The patient is a symptomatically on my examination. He has reassuring vital signs except for hypertension, and did not take his morning medications. He is not sure what his antihypertensive is, but I will treat him with metoprolol. We'll get basic labs and treat the patient with Toradol, but if his workup in the emergency department is reassuring, there is no imaging indicated at this time.  Examination does not yield any signs or symptoms that would be concerning for an acute surgical abdominal pathology or acute infectious pathology. We will also get a screening EKG to evaluate for cardiac causes, although this is less likely. I would consider renal stones, but the patient has no flank pain on my examination, has not been having urinary symptoms or hematuria.  ____________________________________________  FINAL CLINICAL IMPRESSION(S) / ED DIAGNOSES  Final diagnoses:  Intermittent left upper quadrant abdominal pain  Hyperglycemia  Essential hypertension    Clinical Course       NEW MEDICATIONS STARTED DURING THIS VISIT:  Discharge Medication List as of 09/22/2016 11:16 AM        Eula Listen, MD 09/22/16 1534

## 2016-11-28 ENCOUNTER — Telehealth: Payer: Self-pay | Admitting: Family Medicine

## 2016-11-28 NOTE — Telephone Encounter (Signed)
Pt was a pt of Dr Gilford Rile pt switched to Dr [name]. Pt will call back to sch appt. Thank you!

## 2016-12-31 DIAGNOSIS — J069 Acute upper respiratory infection, unspecified: Secondary | ICD-10-CM | POA: Diagnosis not present

## 2017-01-07 ENCOUNTER — Ambulatory Visit (INDEPENDENT_AMBULATORY_CARE_PROVIDER_SITE_OTHER): Payer: BLUE CROSS/BLUE SHIELD | Admitting: Family Medicine

## 2017-01-07 ENCOUNTER — Encounter: Payer: Self-pay | Admitting: Family Medicine

## 2017-01-07 VITALS — BP 128/82 | HR 94 | Temp 98.5°F | Wt 332.4 lb

## 2017-01-07 DIAGNOSIS — I10 Essential (primary) hypertension: Secondary | ICD-10-CM

## 2017-01-07 DIAGNOSIS — E119 Type 2 diabetes mellitus without complications: Secondary | ICD-10-CM | POA: Diagnosis not present

## 2017-01-07 DIAGNOSIS — K219 Gastro-esophageal reflux disease without esophagitis: Secondary | ICD-10-CM

## 2017-01-07 DIAGNOSIS — K625 Hemorrhage of anus and rectum: Secondary | ICD-10-CM

## 2017-01-07 DIAGNOSIS — R0789 Other chest pain: Secondary | ICD-10-CM

## 2017-01-07 NOTE — Assessment & Plan Note (Signed)
Rarely has on the left side of his chest depending on how he sleeps. No cardiac symptoms. Previously evaluated by cardiology and felt to be musculoskeletal. He had a reassuring exercise tolerance test. He'll continue to monitor.

## 2017-01-07 NOTE — Patient Instructions (Signed)
Nice to meet you. We'll check some lab work and contact you with the results. We'll refer you to GI. If you have recurrent bleeding from her rectum or develop any abdominal pain please seek medical attention immediately.

## 2017-01-07 NOTE — Progress Notes (Signed)
Pre visit review using our clinic review tool, if applicable. No additional management support is needed unless otherwise documented below in the visit note. 

## 2017-01-07 NOTE — Assessment & Plan Note (Signed)
At goal on recheck. We'll check lab work as outlined below. He'll continue his current medications.

## 2017-01-07 NOTE — Assessment & Plan Note (Signed)
Check A1c. Foot exam completed.

## 2017-01-07 NOTE — Assessment & Plan Note (Signed)
Patient with persistent intermittent symptoms despite being on omeprazole. We'll refer to GI for further evaluation particularly in the setting of rectal bleeding that he had previously. I discussed doing a rectal exam though he declined this. He was given stool cards. We'll check a CBC.

## 2017-01-07 NOTE — Progress Notes (Signed)
[name], MD Phone: [phone]  Eric Stevens is a 46 y.o. male who presents today for follow-up.  HYPERTENSION  Disease Monitoring  Home BP Monitoring not checking Chest pain- no    Dyspnea- no Medications  Compliance-  no.  Edema- no  Patient does note an occasional soreness in his left pectoral muscle when he sleeps. There is no chest pressure or radiation. There is no exertional component. It only occurs when he sleeps occasionally. He has been evaluated cardiology for this and it was to be musculoskeletal.  Diabetes: Patient was not aware he had diabetes. Prior A1c's have been in the upper sixes. 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 No polydipsia. Does not exercise.  GERD: Patient takes omeprazole. He still gets symptoms with this occasionally depending on what he eats. Particularly tomato based sauces and spicy food. No abdominal pain. Does note some time in the last several months he had blood when wiping after having a bowel movement. He states he went to the hospital and they felt it was related to him straining. He notes no pain when having a bowel movement. He reports having had a colonoscopy a long time ago though none recently. Has not been evaluated by GI. No history of colon cancer in his family.  PMH: Smoker   ROS see history of present illness  Objective  Physical Exam Vitals:   01/07/17 1552 01/07/17 1621  BP: (!) 140/92 128/82  Pulse: 94   Temp: 98.5 F (36.9 C)     BP Readings from Last 3 Encounters:  01/07/17 128/82  09/22/16 130/87  04/02/16 140/88   Wt Readings from Last 3 Encounters:  01/07/17 (!) 332 lb 6.4 oz (150.8 kg)  09/22/16 (!) 325 lb (147.4 kg)  04/02/16 (!) 335 lb (152 kg)    Physical Exam  Constitutional: No distress.  Cardiovascular: Normal rate, regular rhythm and normal heart sounds.   Pulmonary/Chest: Effort normal and breath sounds normal.  Abdominal: Soft. Bowel sounds are  normal. He exhibits no distension. There is no tenderness. There is no rebound and no guarding.  Genitourinary:  Genitourinary Comments: Declined rectal exam  Neurological: He is alert. Gait normal.  Skin: Skin is warm and dry. He is not diaphoretic.   Diabetic Foot Exam - Simple   Simple Foot Form Diabetic Foot exam was performed with the following findings:  Yes 01/07/2017  4:42 PM  Visual Inspection See comments:  Yes Sensation Testing Intact to touch and monofilament testing bilaterally:  Yes Pulse Check Posterior Tibialis and Dorsalis pulse intact bilaterally:  Yes Comments Patient with thickened toenails that are dark bilaterally, calluses noted on the bottoms of his feet with no ulcerations or skin breakdown     Assessment/Plan: Please see individual problem list.  Hypertension At goal on recheck. We'll check lab work as outlined below. He'll continue his current medications.  Diabetes mellitus, type II Check A1c. Foot exam completed.  Muscular chest pain Rarely has on the left side of his chest depending on how he sleeps. No cardiac symptoms. Previously evaluated by cardiology and felt to be musculoskeletal. He had a reassuring exercise tolerance test. He'll continue to monitor.  GERD (gastroesophageal reflux disease) Patient with persistent intermittent symptoms despite being on omeprazole. We'll refer to GI for further evaluation particularly in the setting of rectal bleeding that he had previously. I discussed doing a rectal exam though he declined this. He was given stool cards. We'll check a CBC.  Orders Placed This Encounter  Procedures  . CBC  . HgB A1c  . Comp Met (CMET)  . Ambulatory referral to Gastroenterology    Referral Priority:   Routine    Referral Type:   Consultation    Referral Reason:   Specialty Services Required    Number of Visits Requested:   [place], MD [place]

## 2017-01-08 LAB — CBC
HCT: 46.5 % (ref 39.0–52.0)
Hemoglobin: 15.7 g/dL (ref 13.0–17.0)
MCHC: 33.7 g/dL (ref 30.0–36.0)
MCV: 92.7 fl (ref 78.0–100.0)
Platelets: 297 10*3/uL (ref 150.0–400.0)
RBC: 5.01 Mil/uL (ref 4.22–5.81)
RDW: 13.3 % (ref 11.5–15.5)
WBC: 10.3 10*3/uL (ref 4.0–10.5)

## 2017-01-08 LAB — COMPREHENSIVE METABOLIC PANEL
ALT: 40 U/L (ref 0–53)
AST: 24 U/L (ref 0–37)
Albumin: 4.3 g/dL (ref 3.5–5.2)
Alkaline Phosphatase: 73 U/L (ref 39–117)
BUN: 16 mg/dL (ref 6–23)
CO2: 29 mEq/L (ref 19–32)
Calcium: 9.9 mg/dL (ref 8.4–10.5)
Chloride: 104 mEq/L (ref 96–112)
Creatinine, Ser: 1.04 mg/dL (ref 0.40–1.50)
GFR: 99.67 mL/min (ref >60.00)
Glucose, Bld: 120 mg/dL — ABNORMAL HIGH (ref 70–99)
Potassium: 4.2 mEq/L (ref 3.5–5.1)
Sodium: 138 mEq/L (ref 135–145)
Total Bilirubin: 0.3 mg/dL (ref 0.2–1.2)
Total Protein: 7.5 g/dL (ref 6.0–8.3)

## 2017-01-08 LAB — HEMOGLOBIN A1C: Hgb A1c MFr Bld: 7.1 % — ABNORMAL HIGH (ref 4.6–6.5)

## 2017-01-13 ENCOUNTER — Other Ambulatory Visit: Payer: BLUE CROSS/BLUE SHIELD

## 2017-01-13 ENCOUNTER — Encounter: Payer: Self-pay | Admitting: Internal Medicine

## 2017-01-13 DIAGNOSIS — Z1211 Encounter for screening for malignant neoplasm of colon: Secondary | ICD-10-CM

## 2017-01-14 ENCOUNTER — Other Ambulatory Visit (INDEPENDENT_AMBULATORY_CARE_PROVIDER_SITE_OTHER): Payer: BLUE CROSS/BLUE SHIELD

## 2017-01-14 DIAGNOSIS — I10 Essential (primary) hypertension: Secondary | ICD-10-CM | POA: Diagnosis not present

## 2017-01-14 LAB — FECAL OCCULT BLOOD, IMMUNOCHEMICAL: Fecal Occult Bld: NEGATIVE

## 2017-01-20 ENCOUNTER — Telehealth: Payer: Self-pay | Admitting: *Deleted

## 2017-01-20 NOTE — Telephone Encounter (Signed)
Pt requested results for his stool test, if available. Pt contact [phone]

## 2017-01-20 NOTE — Telephone Encounter (Signed)
Patient notified of negative results.

## 2017-01-23 ENCOUNTER — Ambulatory Visit: Payer: BLUE CROSS/BLUE SHIELD | Admitting: Internal Medicine

## 2017-02-26 ENCOUNTER — Ambulatory Visit: Payer: BLUE CROSS/BLUE SHIELD | Admitting: Internal Medicine

## 2017-03-03 ENCOUNTER — Other Ambulatory Visit: Payer: Self-pay

## 2017-03-03 NOTE — Telephone Encounter (Signed)
Last OV 01/07/17 last filled 01/31/16 90 3rf filled by Dr.Walker

## 2017-03-03 NOTE — Telephone Encounter (Signed)
Please confirm he is taking these medications. Then I will refill. Thanks.

## 2017-03-03 NOTE — Telephone Encounter (Signed)
Last filled by Dr.Walker 01/31/16 90 3rf

## 2017-03-04 MED ORDER — DILTIAZEM HCL ER COATED BEADS 240 MG PO CP24: 240.0000 mg | ORAL_CAPSULE | Freq: Every day | ORAL | 1 refills | Status: DC

## 2017-03-04 MED ORDER — LOSARTAN POTASSIUM-HCTZ 100-12.5 MG PO TABS: 1.0000 | ORAL_TABLET | Freq: Every day | ORAL | 1 refills | Status: DC

## 2017-03-04 NOTE — Telephone Encounter (Signed)
Patient states he is taking both

## 2017-03-27 IMAGING — CT CT ANGIO CHEST
1 of 2 series · 19 of 32 positions shown · IV contrast (APPLIED)
Comparison: 10/29/2015 CHEST X-RAY

CLINICAL DATA: ACUTE CHEST PAIN RADIATING TO THE BACK WITH LEFT ARM
AND NECK PAIN.

EXAM:
CT ANGIOGRAPHY CHEST WITH CONTRAST
TECHNIQUE: Multidetector CT imaging of the chest was performed using the
standard protocol during bolus administration of intravenous
contrast. Multiplanar CT image reconstructions and MIPs were
obtained to evaluate the vascular anatomy.
CONTRAST:  100mL OMNIPAQUE IOHEXOL 350 MG/ML SOLN

[Series 7: cta chest · axial · 0.86mm/px · z∈[-806,-510]mm · 19 of 164 slices shown]
[im 8/164  lung]
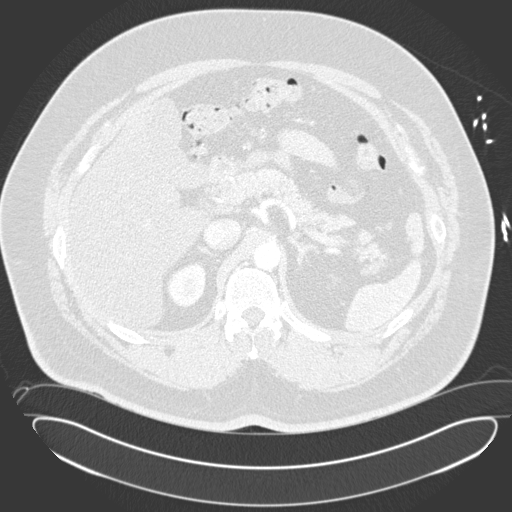
[im 15/164  soft-tissue]
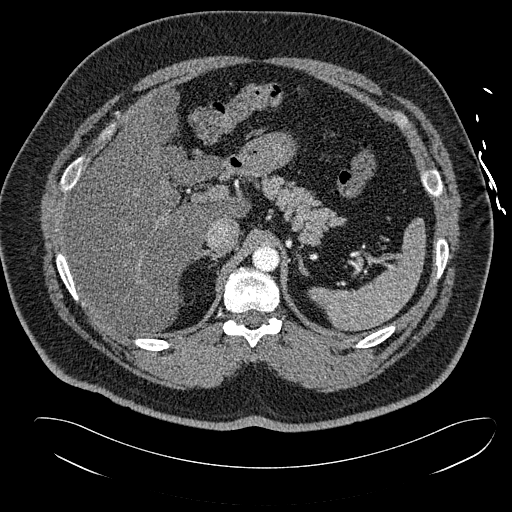
[im 23/164  lung]
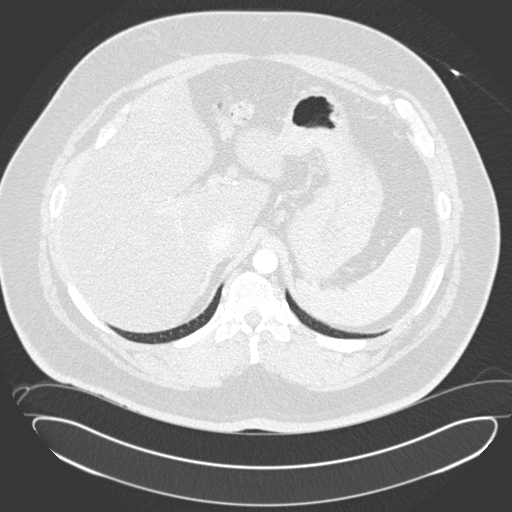
[im 30/164  soft-tissue]
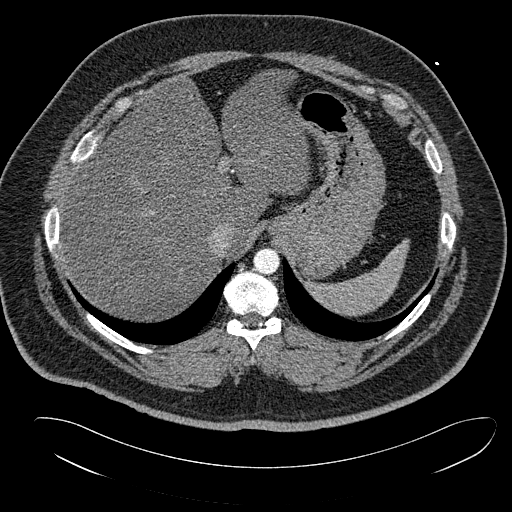
[im 38/164  lung]
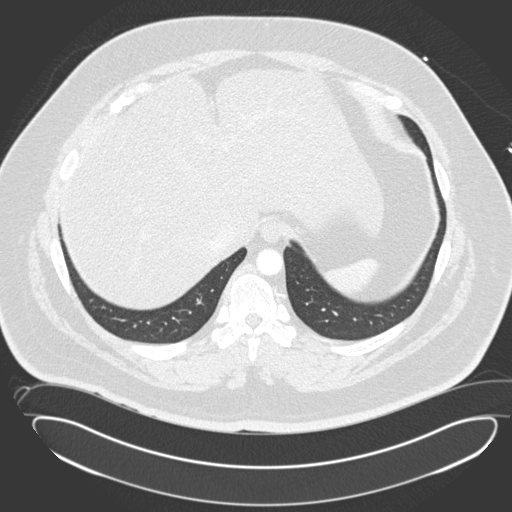
[im 52/164  soft-tissue]
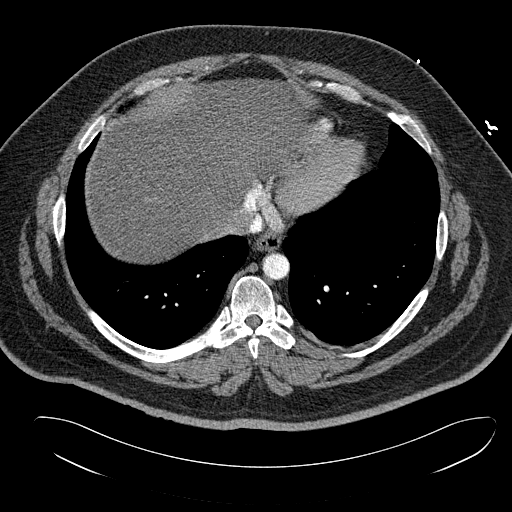
[im 60/164  lung]
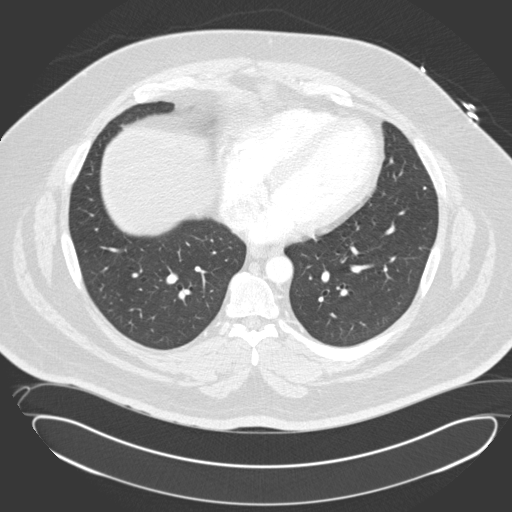
[im 67/164  soft-tissue]
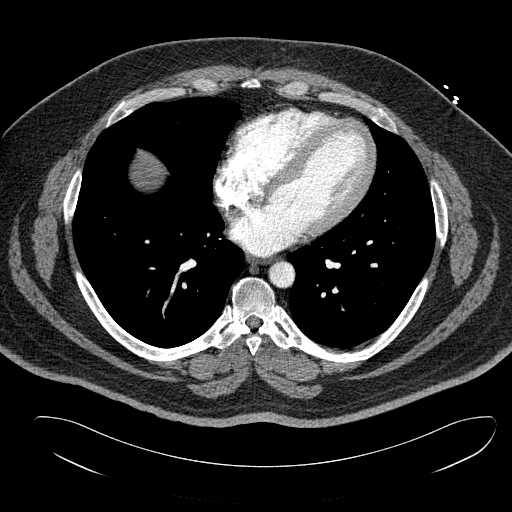
[im 75/164  lung]
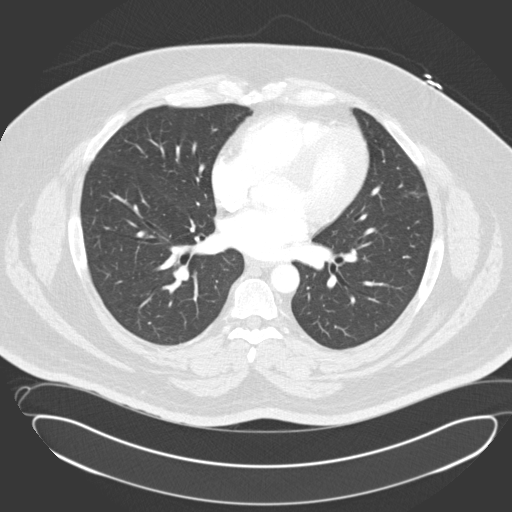
[im 82/164  soft-tissue]
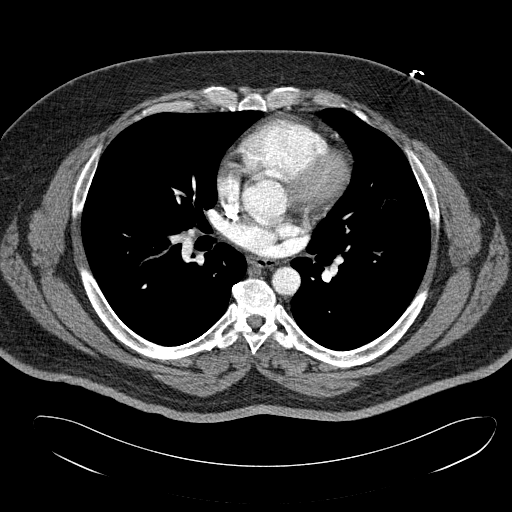
[im 89/164  lung]
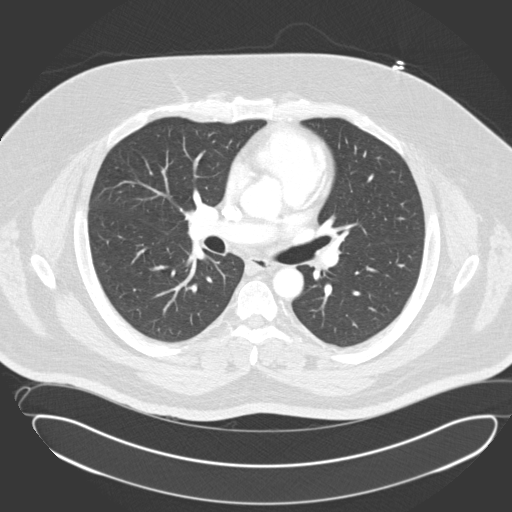
[im 97/164  soft-tissue]
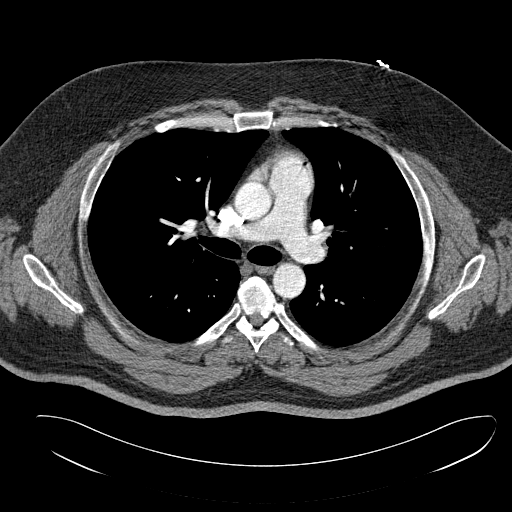
[im 104/164  lung]
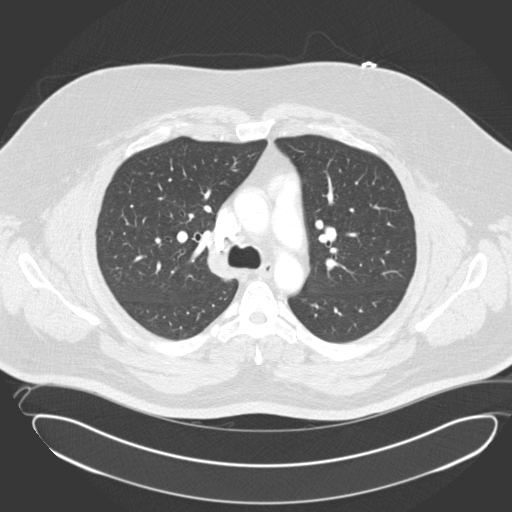
[im 112/164  soft-tissue]
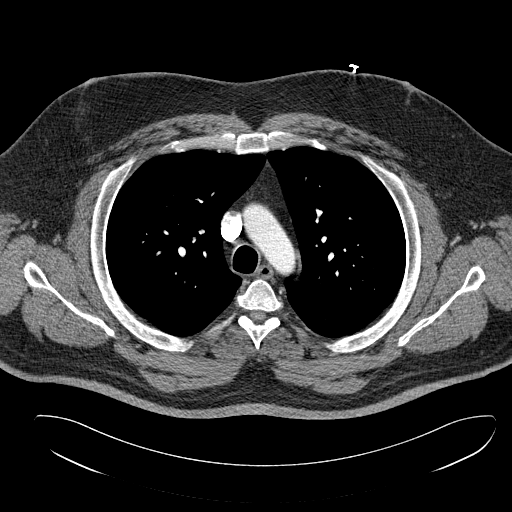
[im 126/164  lung]
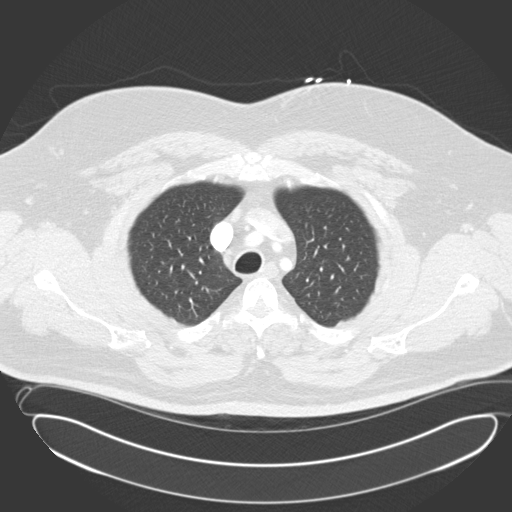
[im 134/164  soft-tissue]
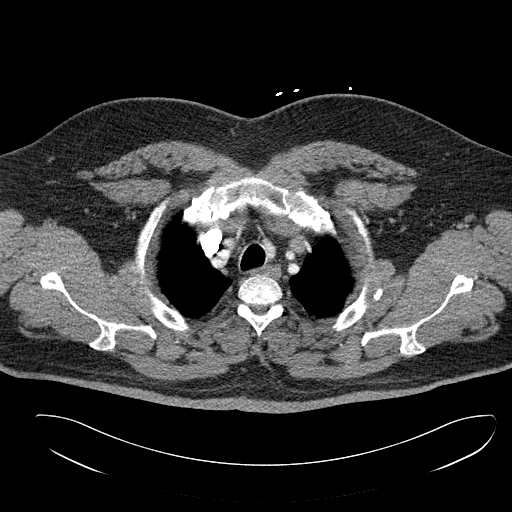
[im 141/164  lung]
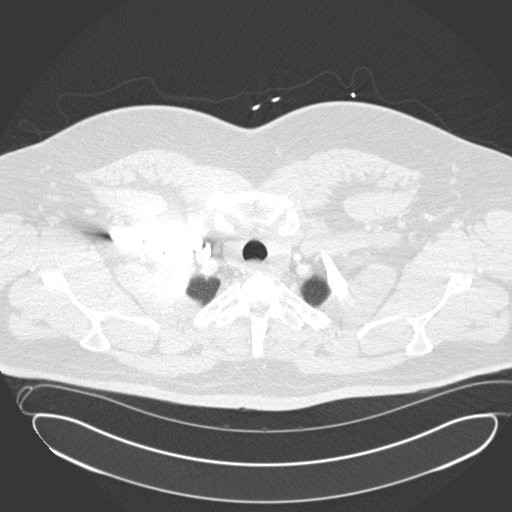
[im 149/164  soft-tissue]
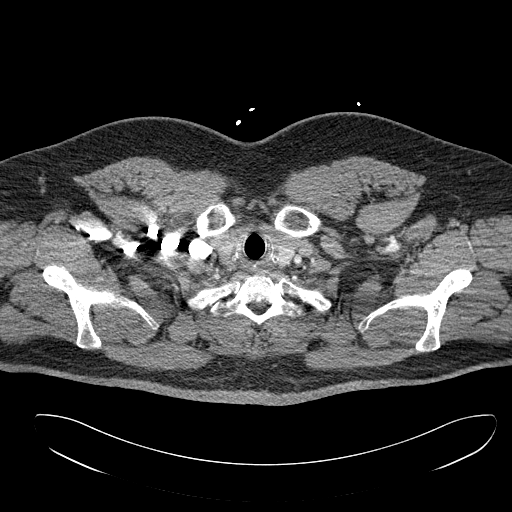
[im 156/164  lung]
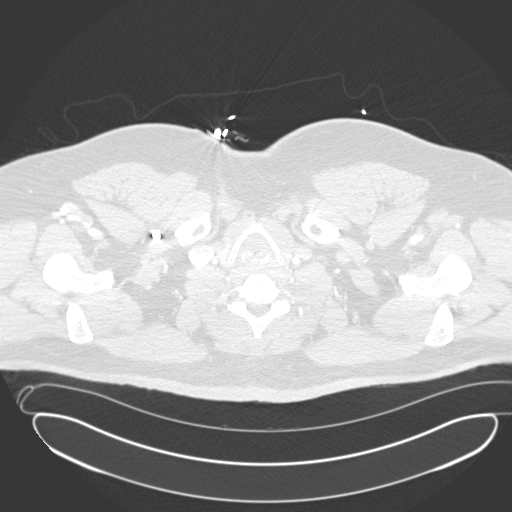

[19 of 32 positions shown; findings below may reference images not displayed]

FINDINGS: Mediastinum/Lymph Nodes: No pulmonary emboli or thoracic aortic
dissection identified. No masses or pathologically enlarged lymph
nodes identified.

Lungs/Pleura: No pulmonary mass, infiltrate, or effusion.

Upper abdomen: Diffuse hypoattenuation of the liver compatible with
hepatic steatosis. No biliary dilatation. No upper abdominal acute
inflammatory process.

Musculoskeletal: No chest wall mass or acute osseous finding.

Review of the MIP images confirms the above findings.
IMPRESSION: Negative for thoracic aortic dissection or significant acute
pulmonary embolus by CTA.

No acute intra thoracic process

Hepatic steatosis

## 2017-03-31 ENCOUNTER — Ambulatory Visit: Payer: BLUE CROSS/BLUE SHIELD | Admitting: Internal Medicine

## 2017-04-08 ENCOUNTER — Ambulatory Visit: Payer: BLUE CROSS/BLUE SHIELD | Admitting: Family Medicine

## 2017-04-11 ENCOUNTER — Ambulatory Visit: Payer: BLUE CROSS/BLUE SHIELD | Admitting: Family Medicine

## 2017-04-28 ENCOUNTER — Encounter: Payer: Self-pay | Admitting: Internal Medicine

## 2017-04-29 ENCOUNTER — Ambulatory Visit: Payer: BLUE CROSS/BLUE SHIELD | Admitting: Internal Medicine

## 2017-05-16 ENCOUNTER — Ambulatory Visit: Payer: BLUE CROSS/BLUE SHIELD | Admitting: Family Medicine

## 2017-05-30 ENCOUNTER — Telehealth: Payer: Self-pay | Admitting: *Deleted

## 2017-05-30 NOTE — Telephone Encounter (Signed)
Patient has requested to know if he could get his gastro referral sent to a local place here in [place] contact [phone]

## 2017-06-02 ENCOUNTER — Ambulatory Visit: Payer: BLUE CROSS/BLUE SHIELD | Admitting: Internal Medicine

## 2017-06-03 NOTE — Telephone Encounter (Signed)
Referral was sent to [facility] on 07/13. Pt is aware of the process of starting over regarding waiting for a appt. Thankyou!

## 2017-06-27 ENCOUNTER — Ambulatory Visit: Payer: BLUE CROSS/BLUE SHIELD | Admitting: Family Medicine

## 2017-07-04 ENCOUNTER — Other Ambulatory Visit: Payer: Self-pay

## 2017-07-07 ENCOUNTER — Encounter: Payer: Self-pay | Admitting: Gastroenterology

## 2017-07-07 ENCOUNTER — Ambulatory Visit (INDEPENDENT_AMBULATORY_CARE_PROVIDER_SITE_OTHER): Payer: BLUE CROSS/BLUE SHIELD | Admitting: Gastroenterology

## 2017-07-07 VITALS — BP 107/70 | HR 86 | Temp 98.2°F | Wt 316.4 lb

## 2017-07-07 DIAGNOSIS — K760 Fatty (change of) liver, not elsewhere classified: Secondary | ICD-10-CM | POA: Diagnosis not present

## 2017-07-07 DIAGNOSIS — K219 Gastro-esophageal reflux disease without esophagitis: Secondary | ICD-10-CM | POA: Diagnosis not present

## 2017-07-07 NOTE — Patient Instructions (Addendum)
1. Perform colonoscopy at age 46 2. Weight loss with diet and exercise 3. Stop prilosec 4. Ranitidine or famotidine OTC as needed for heart burn 5. Follow up if symptoms worse  Please call our office to speak with my nurse Driscilla Grammes at [phone] during business hours from 8am to 4pm if you have any questions/concerns. During after hours, you will be redirected to on call GI physician. For any emergency please call 911 or go the nearest emergency room.    [name], MD [address]  [place]  [place], [place] [address]  Main: [phone]  Fax: [phone]

## 2017-07-07 NOTE — Progress Notes (Signed)
Eric Darby, MD [address]  [place]  [place], [place] [address]  Main: [phone]  Fax: [phone]    Gastroenterology Consultation  Referring Provider:     [name], MD Primary Care Physician:  [name], MD Primary Gastroenterologist:  Dr. Cephas Stevens Reason for Consultation:     Heartburn        HPI:   Eric Stevens is a 46 y.o. y/o male referred for consultation & management  by Dr. [name], [name], MD of GERD.  He has been experiencing retrosternal burning pain about once a week or even less than that for a few years. He was recommended to take omeprazole and he has been taking daily until 2 years ago, stopped by himself as his heart burn was always intermittent. Currently, he is taking omeprazole as needed. He reports regurgitation with episodes of heartburn. These symptoms are exacerbated with fatty foods. He denies any other upper GI symptoms. He does report rectal bleeding whenever he drinks alcohol and he thinks his hemorrhoids get irritated during this time. He denies any altered bowel habits. His BMI is 42. He denies taking NSAIDs. There is no evidence of any anemia. GI Procedures:colonoscopy in 2003 for abdominal pain, it was negative. No prior EGD.  He has fatty liver based on ultrasound in October 2014 that was performed for mildly elevated transaminases. His LFTs are currently normal  He smokes 2-3 cigars daily, drinks alcohol occasionally. He is a truck driver He denies family history of GI conditions including malignancy.  Past Medical History:  Diagnosis Date  . Hypertension   . Prostatitis    Dr. Eliberto Ivory  . Sleep apnea     No past surgical history on file.  Prior to Admission medications   Medication Sig Start Date End Date Taking? Authorizing Provider  diltiazem (CARTIA XT) 240 MG 24 hr capsule Take 1 capsule (240 mg total) by mouth daily. 03/04/17  Yes [name], MD  glucose blood test strip Use as  instructed with One Touch Verio to test blood sugars twice daily. Dx: E11.9 01/31/16  Yes [name], MD  hydrocortisone (ANUSOL-HC) 25 MG suppository Place rectally.   Yes [provider]  losartan-hydrochlorothiazide (HYZAAR) 100-12.5 MG tablet Take 1 tablet by mouth daily. 03/04/17  Yes [name], MD  omeprazole (PRILOSEC) 20 MG capsule Take 20 mg by mouth daily.   Yes [provider]  fluticasone (FLONASE) 50 MCG/ACT nasal spray Place into the nose. 12/31/16   [provider]    Family History  Problem Relation Age of Onset  . Hypertension Father   . Cancer Father        prostate, brain  . Diabetes Brother   . Aneurysm Mother        brain  . Hypertension Mother      Social History  Substance Use Topics  . Smoking status: Current Some Day Smoker    Years: 5.00    Types: Cigarettes  . Smokeless tobacco: Never Used  . Alcohol use Yes     Comment: occassionally    Allergies as of 07/07/2017 - Review Complete 07/07/2017  Allergen Reaction Noted  . Lisinopril Cough 02/15/2013  . Metformin Diarrhea 05/19/2014  . Metformin and related  05/19/2014    Review of Systems:    All systems reviewed and negative except where noted in HPI.   Physical Exam:  BP 107/70   Pulse 86   Temp 98.2  F (36.8 C) (Oral)   Wt (!) 143.5 kg (316 lb 6.4 oz)   BMI 39.55 kg/m  No LMP for male patient.  General:   Alert,  Well-developed, well-nourished, pleasant and cooperative in NAD Head:  Normocephalic and atraumatic. Eyes:  Sclera clear, no icterus.   Conjunctiva pink. Ears:  Normal auditory acuity. Nose:  No deformity, discharge, or lesions. Mouth:  No deformity or lesions,oropharynx pink & moist. Neck:  Supple; no masses or thyromegaly. Lungs:  Respirations even and unlabored.  Clear throughout to auscultation.   No wheezes, crackles, or rhonchi. No acute distress. Heart:  Regular rate and rhythm; no murmurs, clicks, rubs, or gallops. Abdomen:   Normal bowel sounds.  No bruits.  Soft, non-tender and non-distended without masses, hepatosplenomegaly or hernias noted.  No guarding or rebound tenderness.   Rectal: Nor performed Msk:  Symmetrical without gross deformities. Good, equal movement & strength bilaterally. Pulses:  Normal pulses noted. Extremities:  No clubbing or edema.  No cyanosis. Neurologic:  Alert and oriented x3;  grossly normal neurologically. Skin:  Intact without significant lesions or rashes. No jaundice. Lymph Nodes:  No significant cervical adenopathy. Psych:  Alert and cooperative. Normal mood and affect.  Imaging Studies: Reviewed  Assessment and Plan:   WASHINGTON WHEDBEE is a 46 y.o. y/o male with BMI of 42, hypertension, prediabetes with chronic intermittent GERD that response to omeprazole as needed. I do not think he needs omeprazole and suggested him to stop taking it as he is taking intermittently anyways. Recommend taking H2 blocker as needed. There is no indication for EGD as he does not have alarm signs or symptoms. Highly encouraged him to lose weight, follow healthy diet and exercise. If GERD symptoms become progressive, then I will perform EGD, restart daily PPI.  Recommend screening colonoscopy at age 7.  - Elevated transaminases: Mild elevation in the past, currently normal. Secondary to fatty liver from metabolic syndrome. Ultrasound showed hepatic steatosis. Low fat low-carb diet as suggested above  Follow up in as needed   Eric Darby, MD

## 2017-08-20 ENCOUNTER — Ambulatory Visit: Payer: BLUE CROSS/BLUE SHIELD | Admitting: Family Medicine

## 2017-11-09 ENCOUNTER — Other Ambulatory Visit: Payer: Self-pay | Admitting: Family Medicine

## 2017-12-01 ENCOUNTER — Encounter: Payer: Self-pay | Admitting: Family Medicine

## 2017-12-01 ENCOUNTER — Ambulatory Visit (INDEPENDENT_AMBULATORY_CARE_PROVIDER_SITE_OTHER): Payer: BLUE CROSS/BLUE SHIELD | Admitting: Family Medicine

## 2017-12-01 ENCOUNTER — Other Ambulatory Visit: Payer: Self-pay

## 2017-12-01 VITALS — BP 120/90 | HR 72 | Temp 98.2°F | Wt 322.6 lb

## 2017-12-01 DIAGNOSIS — E119 Type 2 diabetes mellitus without complications: Secondary | ICD-10-CM | POA: Diagnosis not present

## 2017-12-01 DIAGNOSIS — E669 Obesity, unspecified: Secondary | ICD-10-CM

## 2017-12-01 DIAGNOSIS — Z8042 Family history of malignant neoplasm of prostate: Secondary | ICD-10-CM

## 2017-12-01 DIAGNOSIS — I10 Essential (primary) hypertension: Secondary | ICD-10-CM | POA: Diagnosis not present

## 2017-12-01 DIAGNOSIS — Z8719 Personal history of other diseases of the digestive system: Secondary | ICD-10-CM | POA: Insufficient documentation

## 2017-12-01 DIAGNOSIS — Z125 Encounter for screening for malignant neoplasm of prostate: Secondary | ICD-10-CM

## 2017-12-01 NOTE — Patient Instructions (Signed)
Nice to see you. We will call you with your lab results. Please work on dietary changes and exercise. GI recommended a screening colonoscopy at age 47.  We can contact them to get this set up if you would like.

## 2017-12-01 NOTE — Progress Notes (Signed)
  Eric Rumps, MD Phone: [phone]  Eric Stevens is a 47 y.o. male who presents today for follow-up.  Hypertension: Not checking at home.  Taking Cartia, losartan, HCTZ.  No chest pain, shortness of breath, or edema.  He has a difficult schedule and has gotten off exercise.  He was walking 2 miles a day previously.  His work schedule is very inconsistent with times.  He eats pretty much whatever he can get his hands on.  Diabetes: Has some intermittent polyuria though no polydipsia.  He saw ophthalmology in the middle of last year.  Reports tetanus vaccination in the last 10 years.  Declines Pneumovax.  Previously referred to GI for GERD and rectal bleeding.  They recommended colonoscopy at age 64.  That did not complete an EGD as he did not have any alarm features.  He reports no additional rectal bleeding since he has cut out alcohol.  He does report a family history of prostate cancer in his father.  Social History   Tobacco Use  Smoking Status Current Some Day Smoker  . Years: 5.00  . Types: Cigarettes  Smokeless Tobacco Never Used     ROS see history of present illness  Objective  Physical Exam Vitals:   12/01/17 1530  BP: 120/90  Pulse: 72  Temp: 98.2 F (36.8 C)  SpO2: 98%    BP Readings from Last 3 Encounters:  12/01/17 120/90  07/07/17 107/70  01/07/17 128/82   Wt Readings from Last 3 Encounters:  12/01/17 (!) 322 lb 9.6 oz (146.3 kg)  07/07/17 (!) 316 lb 6.4 oz (143.5 kg)  01/07/17 (!) 332 lb 6.4 oz (150.8 kg)    Physical Exam  Constitutional: No distress.  Cardiovascular: Normal rate, regular rhythm and normal heart sounds.  Pulmonary/Chest: Effort normal and breath sounds normal.  Genitourinary:  Genitourinary Comments: Patient declines rectal exam  Musculoskeletal: He exhibits no edema.  Neurological: He is alert. Gait normal.  Skin: Skin is warm and dry. He is not diaphoretic.     Assessment/Plan: Please see individual problem  list.  Hypertension Not at goal.  We will increase his HCTZ once his lab work returns.  Will have to return in 3-4 weeks for repeat labs.  History of rectal bleeding History of this previously with alcohol use.  He did see GI and mentioned this to them.  He has not had any recurrence.  They did not complete an EGD.  They did recommend colonoscopy at age 3.  Patient is currently 15 and we will get him back in to see his GI physician.  Obesity (BMI 30-39.9) Discussed diet and exercise.  I encouraged him to try to make an effort despite his busy and odd work hours.  Family history of prostate cancer in father We will check a PSA.  Patient declined digital rectal exam.  Diabetes mellitus, type II We will check an A1c.  Encourage dietary changes and exercise.  May need to consider adding medication.  Patient declined pneumonia vaccine.   Orders Placed This Encounter  Procedures  . Comp Met (CMET)  . Direct LDL  . PSA  . HgB A1c    No orders of the defined types were placed in this encounter.    Eric Rumps, MD [place]

## 2017-12-01 NOTE — Assessment & Plan Note (Signed)
Not at goal.  We will increase his HCTZ once his lab work returns.  Will have to return in 3-4 weeks for repeat labs.

## 2017-12-01 NOTE — Assessment & Plan Note (Signed)
History of this previously with alcohol use.  He did see GI and mentioned this to them.  He has not had any recurrence.  They did not complete an EGD.  They did recommend colonoscopy at age 47.  Patient is currently 68 and we will get him back in to see his GI physician.

## 2017-12-01 NOTE — Assessment & Plan Note (Signed)
We will check a PSA.  Patient declined digital rectal exam.

## 2017-12-01 NOTE — Assessment & Plan Note (Signed)
Discussed diet and exercise.  I encouraged him to try to make an effort despite his busy and odd work hours.

## 2017-12-01 NOTE — Assessment & Plan Note (Signed)
We will check an A1c.  Encourage dietary changes and exercise.  May need to consider adding medication.  Patient declined pneumonia vaccine.

## 2017-12-02 LAB — COMPREHENSIVE METABOLIC PANEL
ALT: 35 U/L (ref 0–53)
AST: 21 U/L (ref 0–37)
Albumin: 4.3 g/dL (ref 3.5–5.2)
Alkaline Phosphatase: 63 U/L (ref 39–117)
BUN: 15 mg/dL (ref 6–23)
CO2: 31 mEq/L (ref 19–32)
Calcium: 10.2 mg/dL (ref 8.4–10.5)
Chloride: 102 mEq/L (ref 96–112)
Creatinine, Ser: 1.11 mg/dL (ref 0.40–1.50)
GFR: 92.08 mL/min (ref >60.00)
Glucose, Bld: 122 mg/dL — ABNORMAL HIGH (ref 70–99)
Potassium: 4.4 mEq/L (ref 3.5–5.1)
Sodium: 140 mEq/L (ref 135–145)
Total Bilirubin: 0.4 mg/dL (ref 0.2–1.2)
Total Protein: 7.2 g/dL (ref 6.0–8.3)

## 2017-12-02 LAB — LDL CHOLESTEROL, DIRECT: Direct LDL: 106 mg/dL

## 2017-12-02 LAB — PSA: PSA: 0.38 ng/mL (ref 0.10–4.00)

## 2017-12-02 LAB — HEMOGLOBIN A1C: Hgb A1c MFr Bld: 7 % — ABNORMAL HIGH (ref 4.6–6.5)

## 2017-12-06 ENCOUNTER — Other Ambulatory Visit: Payer: Self-pay | Admitting: Family Medicine

## 2017-12-06 DIAGNOSIS — E785 Hyperlipidemia, unspecified: Secondary | ICD-10-CM

## 2017-12-06 MED ORDER — ATORVASTATIN CALCIUM 40 MG PO TABS: 40.0000 mg | ORAL_TABLET | Freq: Every day | ORAL | 3 refills | Status: DC

## 2017-12-16 ENCOUNTER — Telehealth: Payer: Self-pay | Admitting: Family Medicine

## 2017-12-16 DIAGNOSIS — G4710 Hypersomnia, unspecified: Secondary | ICD-10-CM

## 2017-12-16 NOTE — Telephone Encounter (Signed)
Please advise   Copied from [place] [phone]. 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 Please advise

## 2017-12-16 NOTE — Telephone Encounter (Signed)
Please advise 

## 2017-12-17 NOTE — Telephone Encounter (Signed)
Patient states it depends on his hours if he is excessively sleepy during the day, he does snore but is not sure if it is every night, he has not been informed that he stops breathing at night and he sometimes wakes up well rested,

## 2017-12-17 NOTE — Telephone Encounter (Signed)
Noted.  Sleep study ordered.

## 2017-12-17 NOTE — Telephone Encounter (Signed)
Please find out if there is a particular reason they wanted this done.  Please see if he is excessively sleepy during the day.  Please see if he wakes up well rested.  Please see if he snores.  Please see if he has been observed to stop breathing at night while he is asleep.  Once I know these things I can determine if I can order this.

## 2018-01-08 ENCOUNTER — Other Ambulatory Visit (INDEPENDENT_AMBULATORY_CARE_PROVIDER_SITE_OTHER): Payer: BLUE CROSS/BLUE SHIELD

## 2018-01-08 DIAGNOSIS — E785 Hyperlipidemia, unspecified: Secondary | ICD-10-CM

## 2018-01-08 LAB — HEPATIC FUNCTION PANEL
ALT: 40 U/L (ref 0–53)
AST: 23 U/L (ref 0–37)
Albumin: 4.1 g/dL (ref 3.5–5.2)
Alkaline Phosphatase: 64 U/L (ref 39–117)
Bilirubin, Direct: 0.1 mg/dL (ref 0.0–0.3)
Total Bilirubin: 0.4 mg/dL (ref 0.2–1.2)
Total Protein: 7.4 g/dL (ref 6.0–8.3)

## 2018-01-08 LAB — LDL CHOLESTEROL, DIRECT: Direct LDL: 44 mg/dL

## 2018-01-20 ENCOUNTER — Encounter: Payer: Self-pay | Admitting: Family Medicine

## 2018-01-20 LAB — PULMONARY FUNCTION TEST

## 2018-02-03 ENCOUNTER — Telehealth: Payer: Self-pay

## 2018-02-03 NOTE — Telephone Encounter (Signed)
patient notified his sleep study came back negative for sleep apnea

## 2018-03-09 ENCOUNTER — Ambulatory Visit: Payer: BLUE CROSS/BLUE SHIELD | Admitting: Family Medicine

## 2018-03-19 ENCOUNTER — Other Ambulatory Visit: Payer: Self-pay | Admitting: Family Medicine

## 2018-06-01 ENCOUNTER — Telehealth: Payer: Self-pay | Admitting: Family Medicine

## 2018-06-01 NOTE — Telephone Encounter (Signed)
Copied from [place] [id number]. Topic: Medical Record Request - Other >> Jun 01, 2018 11:00 AM Alfredia Ferguson R wrote: Pt is calling wanting a copy of his sleep study results

## 2018-11-20 ENCOUNTER — Other Ambulatory Visit: Payer: Self-pay | Admitting: Family Medicine

## 2018-11-30 DIAGNOSIS — J069 Acute upper respiratory infection, unspecified: Secondary | ICD-10-CM | POA: Diagnosis not present

## 2018-11-30 DIAGNOSIS — R509 Fever, unspecified: Secondary | ICD-10-CM | POA: Diagnosis not present

## 2018-12-14 ENCOUNTER — Other Ambulatory Visit: Payer: Self-pay | Admitting: Family Medicine

## 2019-01-08 ENCOUNTER — Other Ambulatory Visit: Payer: Self-pay | Admitting: Family Medicine

## 2019-01-08 NOTE — Telephone Encounter (Signed)
Last OV 12/01/2017   Last refilled 03/20/2018 disp 90 with 1 refill   No future appt schedule will need to call pt to schedule an appt for refills

## 2019-01-08 NOTE — Telephone Encounter (Signed)
Called and spoke with pt. Pt has scheduled for an appt the soonest date was June 8.2020 @ 8 AM.   Durene Cal to PCP for approval to refill RX.   Pt stated that he was without insurance and this is why he has not been seen sooner however, he has insurance now.

## 2019-01-08 NOTE — Telephone Encounter (Signed)
Refill sent to pharmacy. He needs to keep his upcoming appointment.  

## 2019-01-27 ENCOUNTER — Other Ambulatory Visit: Payer: Self-pay | Admitting: Family Medicine

## 2019-02-11 ENCOUNTER — Encounter: Payer: Self-pay | Admitting: Family Medicine

## 2019-02-12 NOTE — Telephone Encounter (Signed)
Called and spoke with pt. Pt stated that his pain started on Monday. Pt is c/o lower abdominal pain and mid-abdominal pain that comes and goes and seems to radiate dual like pain. Pt stated that he has had diarrhea twice since Monday and did notice some blood after his BM on Monday when wiping.No vomiting, no fever, no nausea.   No other signs and symptoms   Sent to PCP to advise what you would to do.

## 2019-02-12 NOTE — Telephone Encounter (Signed)
Called and spoke with pt. Pt denied any respirtory symptoms. Per PCP pt has been advised to go be seen at an urgent care today to be seen. Pt advised and voiced understanding.

## 2019-02-12 NOTE — Telephone Encounter (Signed)
Please contact the patient and get more details. Is he still having pain? Is he having diarrhea, vomiting, nausea? Is he having fever? Where is the abdominal pain? Any other symptoms?

## 2019-02-12 NOTE — Telephone Encounter (Signed)
Can you make sure he is not having any respiratory issues?

## 2019-03-12 DIAGNOSIS — J039 Acute tonsillitis, unspecified: Secondary | ICD-10-CM | POA: Diagnosis not present

## 2019-03-15 ENCOUNTER — Other Ambulatory Visit: Payer: Self-pay | Admitting: Family Medicine

## 2019-03-18 ENCOUNTER — Other Ambulatory Visit: Payer: Self-pay | Admitting: Family Medicine

## 2019-03-19 ENCOUNTER — Other Ambulatory Visit: Payer: Self-pay | Admitting: Family Medicine

## 2019-04-26 ENCOUNTER — Telehealth: Payer: Self-pay | Admitting: Family Medicine

## 2019-04-26 ENCOUNTER — Encounter: Payer: Self-pay | Admitting: Family Medicine

## 2019-04-26 ENCOUNTER — Ambulatory Visit (INDEPENDENT_AMBULATORY_CARE_PROVIDER_SITE_OTHER): Payer: BC Managed Care – PPO | Admitting: Family Medicine

## 2019-04-26 ENCOUNTER — Other Ambulatory Visit: Payer: Self-pay

## 2019-04-26 ENCOUNTER — Telehealth: Payer: Self-pay | Admitting: *Deleted

## 2019-04-26 DIAGNOSIS — R509 Fever, unspecified: Secondary | ICD-10-CM | POA: Diagnosis not present

## 2019-04-26 DIAGNOSIS — E119 Type 2 diabetes mellitus without complications: Secondary | ICD-10-CM

## 2019-04-26 DIAGNOSIS — I10 Essential (primary) hypertension: Secondary | ICD-10-CM

## 2019-04-26 DIAGNOSIS — Z20822 Contact with and (suspected) exposure to covid-19: Secondary | ICD-10-CM

## 2019-04-26 NOTE — Assessment & Plan Note (Signed)
Patient has felt feverish though no temperature was checked.  This is in association with achiness and dizziness as well as brief episodes of diarrhea over the weekend.  This in combination with his possible exposure to someone who is being tested for COVID-19 is concerning for possible COVID-19.  This could also be related to some other cause.  I discussed that out of an abundance of caution with his symptoms that we would presume that these were related to COVID-19 until testing was negative.  Advised that he needs to quarantine himself at home starting now and that this quarantine would last until his COVID-19 test was negative or until it had been 10 days since the onset of his symptoms and he had been 3 days with out any symptoms.  Discussed that family members should try to work from home if possible and if not possible they need to wear a mask and check their temperatures when going to work.  Discussed that if they develop symptoms they would need to quarantining contact their physician.  Discussed reasons to seek medical attention in the emergency department.  We will get him set up for COVID testing.  We will have him set up for the [place] monitoring as well.

## 2019-04-26 NOTE — Telephone Encounter (Signed)
Message requesting that the patient be scheduled for COVID 19 testing. Patient with onset of feeling feverish over the weekend with body aches. Also with diarrhea over the weekend. Patient had exposure to person at work who was sent home and is being tested for [place]. Risk factors include diabetes and hypertension.

## 2019-04-26 NOTE — Assessment & Plan Note (Signed)
Discussed working on diet and exercise.  He will have lab work at some point in the near future once COVID-19 evaluation has been completed.

## 2019-04-26 NOTE — Telephone Encounter (Signed)
Pt scheduled for testing on 04/27/19 at [facility].

## 2019-04-26 NOTE — Telephone Encounter (Signed)
Patient called and scheduled for testing at [facility] site on 04/27/19 at 8:30 am. Pt advised to wear a mask and remain in car at appt time. Understanding verbalized.

## 2019-04-26 NOTE — Progress Notes (Signed)
Virtual Visit via Telephone Note  This visit type was conducted due to national recommendations for restrictions regarding the COVID-19 pandemic (e.g. social distancing).  This format is felt to be most appropriate for this patient at this time.  All issues noted in this document were discussed and addressed.  No physical exam was performed (except for noted visual exam findings with Video Visits).   I connected with Eric Stevens today at  4:00 PM EDT by telephone and verified that I am speaking with the correct person using two identifiers. Location patient: home Location provider: work Persons participating in the virtual visit: patient, provider  I discussed the limitations, risks, security and privacy concerns of performing an evaluation and management service by telephone and the availability of in person appointments. I also discussed with the patient that there may be a patient responsible charge related to this service. The patient expressed understanding and agreed to proceed.  Interactive audio and video telecommunications were attempted between this provider and patient, however failed, due to patient having technical difficulties OR patient did not have access to video capability.  We continued and completed visit with audio only.  Reason for visit: Follow-up.  HPI: Fever: Patient reports the last 2 nights he has felt feverish.  They did not check his temperature.  He has had some achiness as well and had some dizziness this morning.  He reports he had diarrhea over the weekend with one liquid stool and 2 loose stools.  He wonders if that was related to drinking milk.  He notes no abdominal pain, nausea, vomiting, blood in his stool, cough, or shortness of breath.  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  The patient notes he was in the same office as the person that never got within 6  feet of him.  Hypertension: Not checking consistently.  He does note it was 140/80 last time it was checked when he was being evaluated for hand injury.  Taking HCTZ and losartan.  No chest pain, shortness of breath, or edema.  Diabetes: Not checking sugars.  No polyuria or polydipsia.  Notes he has not been exercising though he is active at work.  His diet is not great all the time.  Occasional soda and some sweet tea.  Not very much junk food.   ROS: See pertinent positives and negatives per HPI.  Past Medical History:  Diagnosis Date  . Hypertension   . Prostatitis    Dr. Eliberto Ivory  . Sleep apnea     No past surgical history on file.  Family History  Problem Relation Age of Onset  . Hypertension Father   . Cancer Father        prostate, brain  . Diabetes Brother   . Aneurysm Mother        brain  . Hypertension Mother     SOCIAL HX: Smoker   Current Outpatient Medications:  .  atorvastatin (LIPITOR) 40 MG tablet, Take 1 tablet by mouth once daily, Disp: 30 tablet, Rfl: 0 .  CARTIA XT 240 MG 24 hr capsule, TAKE 1 CAPSULE BY MOUTH ONCE DAILY, Disp: 90 capsule, Rfl: 0 .  glucose blood test strip, Use as instructed with One Touch Verio to test blood sugars twice daily. Dx: E11.9, Disp: 100 each, Rfl: 5 .  hydrochlorothiazide (HYDRODIURIL) 12.5 MG tablet, Take 1 tablet by mouth once daily, Disp:  90 tablet, Rfl: 0 .  losartan (COZAAR) 100 MG tablet, Take 1 tablet by mouth once daily, Disp: 90 tablet, Rfl: 0 .  losartan-hydrochlorothiazide (HYZAAR) 100-12.5 MG tablet, TAKE 1 TABLET BY MOUTH ONCE DAILY, Disp: 90 tablet, Rfl: 0  EXAM: No physical exam was completed given this was a telehealth telephone visit.  ASSESSMENT AND PLAN:  Discussed the following assessment and plan:  Fever, unspecified fever cause - Plan: [place], Temperature monitoring  Essential hypertension  Type 2 diabetes mellitus without complication, without long-term current  use of insulin (HCC)  Fever Patient has felt feverish though no temperature was checked.  This is in association with achiness and dizziness as well as brief episodes of diarrhea over the weekend.  This in combination with his possible exposure to someone who is being tested for COVID-19 is concerning for possible COVID-19.  This could also be related to some other cause.  I discussed that out of an abundance of caution with his symptoms that we would presume that these were related to COVID-19 until testing was negative.  Advised that he needs to quarantine himself at home starting now and that this quarantine would last until his COVID-19 test was negative or until it had been 10 days since the onset of his symptoms and he had been 3 days with out any symptoms.  Discussed that family members should try to work from home if possible and if not possible they need to wear a mask and check their temperatures when going to work.  Discussed that if they develop symptoms they would need to quarantining contact their physician.  Discussed reasons to seek medical attention in the emergency department.  We will get him set up for COVID testing.  We will have him set up for the [place] monitoring as well.  Hypertension Discussed that we would need to have him into the office at some point in the future to check her blood pressure.  This will be deferred until we have COVID-19 testing results.  He will continue on his current medication.  We will plan to have lab work completed at some point in the future once his COVID-19 testing returned and quarantine been completed.  Diabetes mellitus, type II Discussed working on diet and exercise.  He will have lab work at some point in the near future once COVID-19 evaluation has been completed.  Message sent to the [facility] to get the patient scheduled for COVID-19 testing.  [place] office staff will contact the patient to get him scheduled for follow-up and lab work once his  COVID-19 testing is completed.   I discussed the assessment and treatment plan with the patient. The patient was provided an opportunity to ask questions and all were answered. The patient agreed with the plan and demonstrated an understanding of the instructions.   The patient was advised to call back or seek an in-person evaluation if the symptoms worsen or if the condition fails to improve as anticipated.  I provided 20 minutes of non-face-to-face time during this encounter.   [name], MD

## 2019-04-26 NOTE — Assessment & Plan Note (Signed)
Discussed that we would need to have him into the office at some point in the future to check her blood pressure.  This will be deferred until we have COVID-19 testing results.  He will continue on his current medication.  We will plan to have lab work completed at some point in the future once his COVID-19 testing returned and quarantine been completed.

## 2019-04-27 ENCOUNTER — Other Ambulatory Visit: Payer: BC Managed Care – PPO

## 2019-04-27 ENCOUNTER — Telehealth: Payer: Self-pay

## 2019-04-27 DIAGNOSIS — Z20822 Contact with and (suspected) exposure to covid-19: Secondary | ICD-10-CM

## 2019-04-27 DIAGNOSIS — R6889 Other general symptoms and signs: Secondary | ICD-10-CM | POA: Diagnosis not present

## 2019-04-27 NOTE — Telephone Encounter (Signed)
Patient called to follow up on message he sent to the doctor to fax the information to his job.  Patient wants to know if it has been done.  If there is a problem, please call patient at [phone]

## 2019-04-27 NOTE — Telephone Encounter (Signed)
Copied from [place] [id number]. Topic: General - Inquiry >> Apr 27, 2019 11:10 AM Scherrie Gerlach wrote: Reason for CRM: p states his job gave him another fax number to fax work note [phone]

## 2019-04-27 NOTE — Telephone Encounter (Signed)
Tried to fax work note three times to number provided by patient. Fax failed three times with no answer. Called and spoke to pt to confirm fax number and to request another fax number.  Pt will check with his job and call back with fax number.

## 2019-04-27 NOTE — Telephone Encounter (Signed)
Faxed work note to new fax number.  Fax confirmation received.  Called and notified pt that work note has been faxed.

## 2019-04-30 LAB — NOVEL CORONAVIRUS, NAA: SARS-CoV-2, NAA: NOT DETECTED

## 2019-05-03 NOTE — Telephone Encounter (Signed)
Pt called to check status of letter to return back to work. Please fax to new number [phone] Attn Pilar Plate

## 2019-05-03 NOTE — Telephone Encounter (Signed)
Patient is calling to check the status of his back to work letter, if test results are included in the fax that sent 6/9. Patient needs to know if this is the correct letter Fax number for patient work : [phone] Call back # [phone]

## 2019-05-03 NOTE — Telephone Encounter (Unsigned)
Copied from [place] [id number]. Topic: Quick Communication - See Telephone Encounter >> May 03, 2019  8:39 AM Eric Stevens wrote: CRM for notification. See Telephone encounter for: 05/03/19. DR S ordered a COVID test on pt on 6/8. Test was negative and pt does not have symptoms and now needs a note to go back to work. Pt wishes to have fax 336 [phone] [employer].

## 2019-05-04 NOTE — Telephone Encounter (Signed)
Pt returned call and stated he hasn't had a fever, diarrhea or any other symptom in the past 3 days / please fax letter to Fax# [phone] Attention: Pilar Plate

## 2019-05-04 NOTE — Telephone Encounter (Signed)
Called patient.  No answer.  LMTCB.

## 2019-05-04 NOTE — Telephone Encounter (Signed)
I had not received his result until I got this phone message. Please confirm that he has not had any fever, diarrhea, or other symptoms over the last 3 days and then I can create the letter for him to return to work. Thanks.

## 2019-05-05 NOTE — Telephone Encounter (Signed)
Noted.  Discussed with Eric Stevens as the patient came into the office for these letters.  Patient notes he has not taken any medication in greater than 3 days and has not had any symptoms in greater than 3 days.  It has been 11 days since the onset of symptoms.  Return to work letter created.

## 2019-05-09 ENCOUNTER — Other Ambulatory Visit: Payer: Self-pay | Admitting: Family Medicine

## 2019-05-26 ENCOUNTER — Other Ambulatory Visit: Payer: Self-pay | Admitting: Family Medicine

## 2019-07-02 ENCOUNTER — Other Ambulatory Visit: Payer: Self-pay | Admitting: Family Medicine

## 2019-07-05 ENCOUNTER — Telehealth: Payer: Self-pay

## 2019-07-05 DIAGNOSIS — I10 Essential (primary) hypertension: Secondary | ICD-10-CM

## 2019-07-05 DIAGNOSIS — E119 Type 2 diabetes mellitus without complications: Secondary | ICD-10-CM

## 2019-07-05 DIAGNOSIS — Z8042 Family history of malignant neoplasm of prostate: Secondary | ICD-10-CM

## 2019-07-05 NOTE — Telephone Encounter (Signed)
Orders placed.

## 2019-07-12 DIAGNOSIS — K644 Residual hemorrhoidal skin tags: Secondary | ICD-10-CM | POA: Diagnosis not present

## 2019-07-23 ENCOUNTER — Other Ambulatory Visit: Payer: Self-pay

## 2019-07-23 ENCOUNTER — Other Ambulatory Visit (INDEPENDENT_AMBULATORY_CARE_PROVIDER_SITE_OTHER): Payer: BC Managed Care – PPO

## 2019-07-23 DIAGNOSIS — I10 Essential (primary) hypertension: Secondary | ICD-10-CM | POA: Diagnosis not present

## 2019-07-23 DIAGNOSIS — E119 Type 2 diabetes mellitus without complications: Secondary | ICD-10-CM

## 2019-07-23 DIAGNOSIS — Z8042 Family history of malignant neoplasm of prostate: Secondary | ICD-10-CM | POA: Diagnosis not present

## 2019-07-23 LAB — COMPREHENSIVE METABOLIC PANEL
ALT: 41 U/L (ref 0–53)
AST: 27 U/L (ref 0–37)
Albumin: 4.1 g/dL (ref 3.5–5.2)
Alkaline Phosphatase: 59 U/L (ref 39–117)
BUN: 23 mg/dL (ref 6–23)
CO2: 27 mEq/L (ref 19–32)
Calcium: 9.4 mg/dL (ref 8.4–10.5)
Chloride: 106 mEq/L (ref 96–112)
Creatinine, Ser: 1.15 mg/dL (ref 0.40–1.50)
GFR: 82.56 mL/min (ref >60.00)
Glucose, Bld: 171 mg/dL — ABNORMAL HIGH (ref 70–99)
Potassium: 3.7 mEq/L (ref 3.5–5.1)
Sodium: 140 mEq/L (ref 135–145)
Total Bilirubin: 0.5 mg/dL (ref 0.2–1.2)
Total Protein: 7.3 g/dL (ref 6.0–8.3)

## 2019-07-23 LAB — LIPID PANEL
Cholesterol: 94 mg/dL (ref 0–200)
HDL: 27.4 mg/dL — ABNORMAL LOW (ref >39.00)
LDL Cholesterol: 43 mg/dL (ref 0–99)
NonHDL: 66.4
Total CHOL/HDL Ratio: 3
Triglycerides: 118 mg/dL (ref 0.0–149.0)
VLDL: 23.6 mg/dL (ref 0.0–40.0)

## 2019-07-23 LAB — PSA: PSA: 0.45 ng/mL (ref 0.10–4.00)

## 2019-07-23 LAB — HEMOGLOBIN A1C: Hgb A1c MFr Bld: 7.3 % — ABNORMAL HIGH (ref 4.6–6.5)

## 2019-07-29 ENCOUNTER — Ambulatory Visit: Payer: Self-pay

## 2019-07-29 MED ORDER — EMPAGLIFLOZIN 10 MG PO TABS: 10.0000 mg | ORAL_TABLET | Freq: Every day | ORAL | 1 refills | Status: DC

## 2019-07-29 NOTE — Telephone Encounter (Signed)
Provided lab results to Patient of Dr. [name] of 07/21/19. Voiced understanding. Patient states that he would like to take Jardiance .  Would like it called into [facility] on [address].

## 2019-07-29 NOTE — Telephone Encounter (Signed)
lmtcb on voicemail. [place] for [employer] to advise.  Tamora Huneke,cma

## 2019-07-29 NOTE — Addendum Note (Signed)
Addended by: [name] ERIC G on: 07/29/2019 11:46 AM   Modules accepted: Orders

## 2019-07-29 NOTE — Telephone Encounter (Signed)
Jardiance sent to [facility].  Please advise the patient that there is a small risk of urinary tract infection or genital yeast infection with this medication and if he develops any kinds of symptoms of those issues he needs to stop the medication and let us know right away.  Please also let him know that if he develops any illness where he has vomiting or diarrhea that he should hold the Jardiance until those symptoms have resolved and then he can resume the Jardiance.  This will help prevent him from getting dehydrated and sicker when he has those symptoms.  He needs follow-up in the office in 3 months if it has not already been scheduled.  Thanks.

## 2019-09-01 ENCOUNTER — Other Ambulatory Visit: Payer: Self-pay

## 2019-09-01 ENCOUNTER — Other Ambulatory Visit: Payer: Self-pay | Admitting: Family Medicine

## 2019-09-01 NOTE — Telephone Encounter (Signed)
Pt called and stated that he is completely out and would like these refilled as soon as possible. Please advise

## 2019-10-20 ENCOUNTER — Other Ambulatory Visit: Payer: Self-pay | Admitting: Family Medicine

## 2019-10-21 ENCOUNTER — Other Ambulatory Visit: Payer: Self-pay

## 2019-10-21 DIAGNOSIS — Z20822 Contact with and (suspected) exposure to covid-19: Secondary | ICD-10-CM

## 2019-10-24 LAB — NOVEL CORONAVIRUS, NAA: SARS-CoV-2, NAA: NOT DETECTED

## 2019-10-28 ENCOUNTER — Other Ambulatory Visit: Payer: Self-pay

## 2019-10-28 DIAGNOSIS — Z20822 Contact with and (suspected) exposure to covid-19: Secondary | ICD-10-CM

## 2019-10-29 LAB — NOVEL CORONAVIRUS, NAA: SARS-CoV-2, NAA: DETECTED — AB

## 2019-12-17 ENCOUNTER — Other Ambulatory Visit: Payer: Self-pay | Admitting: Family Medicine

## 2019-12-28 ENCOUNTER — Other Ambulatory Visit: Payer: Self-pay | Admitting: Family Medicine

## 2020-02-04 ENCOUNTER — Encounter: Payer: Self-pay | Admitting: Family Medicine

## 2020-02-04 ENCOUNTER — Telehealth (INDEPENDENT_AMBULATORY_CARE_PROVIDER_SITE_OTHER): Payer: BC Managed Care – PPO | Admitting: Family Medicine

## 2020-02-04 ENCOUNTER — Other Ambulatory Visit: Payer: Self-pay

## 2020-02-04 DIAGNOSIS — E119 Type 2 diabetes mellitus without complications: Secondary | ICD-10-CM

## 2020-02-04 DIAGNOSIS — Z1211 Encounter for screening for malignant neoplasm of colon: Secondary | ICD-10-CM | POA: Insufficient documentation

## 2020-02-04 DIAGNOSIS — E785 Hyperlipidemia, unspecified: Secondary | ICD-10-CM | POA: Insufficient documentation

## 2020-02-04 DIAGNOSIS — I10 Essential (primary) hypertension: Secondary | ICD-10-CM | POA: Diagnosis not present

## 2020-02-04 MED ORDER — ATORVASTATIN CALCIUM 40 MG PO TABS: 40.0000 mg | ORAL_TABLET | Freq: Every day | ORAL | 1 refills | Status: DC

## 2020-02-04 NOTE — Assessment & Plan Note (Signed)
-  Continue Lipitor °

## 2020-02-04 NOTE — Assessment & Plan Note (Signed)
Refer for colonoscopy 

## 2020-02-04 NOTE — Assessment & Plan Note (Signed)
Undetermined control.  We will have him come in to the office to check his blood pressure.  He will have a BMP.  He will continue his current regimen.

## 2020-02-04 NOTE — Assessment & Plan Note (Signed)
We will check an A1c.  We will get that result will determine if we need to start Jardiance.

## 2020-02-04 NOTE — Progress Notes (Addendum)
Virtual Visit via telephone Note  This visit type was conducted due to national recommendations for restrictions regarding the COVID-19 pandemic (e.g. social distancing).  This format is felt to be most appropriate for this patient at this time.  All issues noted in this document were discussed and addressed.  No physical exam was performed (except for noted visual exam findings with Video Visits).   I connected with Corliss Blacker today at  4:00 PM EDT by telephone and verified that I am speaking with the correct person using two identifiers. Location patient: home Location provider: home office Persons participating in the virtual visit: patient, provider  I discussed the limitations, risks, security and privacy concerns of performing an evaluation and management service by telephone and the availability of in person appointments. I also discussed with the patient that there may be a patient responsible charge related to this service. The patient expressed understanding and agreed to proceed.  Interactive audio and video telecommunications were attempted between this provider and patient, however failed, due to patient having technical difficulties OR patient did not have access to video capability.  We continued and completed visit with audio only.   Reason for visit: Follow-up.  HPI: Hypertension: Not consistently checking though he notes he checked on his mom's cuff which he believes is too small and notes it was 120/101 and then 130/90.  He is taking Cartia, losartan, and HCTZ.  No chest pain, shortness of breath, or edema.  Diabetes: He is not checking blood sugars.  He never got the Jardiance as it appears it was not electronically sent to his pharmacy.  No polyuria or polydipsia.  No hypoglycemia.  He has not seen an ophthalmologist in the last year.  Hyperlipidemia: Taking Lipitor.  No right upper quadrant pain or myalgias.  Colon cancer screening: Patient reports no history of  colon cancer in his family.  We discussed the change in recommendation to start colon cancer screening at age 24.   ROS: See pertinent positives and negatives per HPI.  Past Medical History:  Diagnosis Date  . Hypertension   . Prostatitis    Dr. Eliberto Ivory  . Sleep apnea     No past surgical history on file.  Family History  Problem Relation Age of Onset  . Hypertension Father   . Cancer Father        prostate, brain  . Diabetes Brother   . Aneurysm Mother        brain  . Hypertension Mother     SOCIAL HX: Smoker   Current Outpatient Medications:  .  ANUCORT-HC 25 MG suppository, INSERT 1 SUPPOSITORY RECTALLY ONCE DAILY, Disp: , Rfl:  .  atorvastatin (LIPITOR) 40 MG tablet, Take 1 tablet (40 mg total) by mouth daily., Disp: 90 tablet, Rfl: 1 .  CARTIA XT 240 MG 24 hr capsule, Take 1 capsule by mouth once daily, Disp: 90 capsule, Rfl: 1 .  glucose blood test strip, Use as instructed with One Touch Verio to test blood sugars twice daily. Dx: E11.9, Disp: 100 each, Rfl: 5 .  hydrochlorothiazide (HYDRODIURIL) 12.5 MG tablet, Take 1 tablet by mouth once daily, Disp: 90 tablet, Rfl: 0 .  losartan (COZAAR) 100 MG tablet, Take 1 tablet by mouth once daily, Disp: 90 tablet, Rfl: 0 .  PROCTOZONE-HC 2.5 % rectal cream, APPLY TO THE SKIN DAILY OR AFTER EACH BOWEL MOVEMENT, Disp: , Rfl:   EXAM: This was a telephone visit and thus no physical exam was completed.  ASSESSMENT AND PLAN:  Discussed the following assessment and plan:  Hypertension Undetermined control.  We will have him come in to the office to check his blood pressure.  He will have a BMP.  He will continue his current regimen.  Diabetes mellitus, type II We will check an A1c.  We will get that result will determine if we need to start Jardiance.  Hyperlipidemia Continue Lipitor.  Colon cancer screening Refer for colonoscopy.  Health maintenance: Discussed Pneumovax though he defers this currently.  Orders Placed  This Encounter  Procedures  . HgB A1c    Standing Status:   Future    Standing Expiration Date:   02/03/2021  . Basic Metabolic Panel (BMET)    Standing Status:   Future    Standing Expiration Date:   02/03/2021  . Ambulatory referral to Gastroenterology    Referral Priority:   Routine    Referral Type:   Consultation    Referral Reason:   Specialty Services Required    Number of Visits Requested:   1    Meds ordered this encounter  Medications  . atorvastatin (LIPITOR) 40 MG tablet    Sig: Take 1 tablet (40 mg total) by mouth daily.    Dispense:  90 tablet    Refill:  1     I discussed the assessment and treatment plan with the patient. The patient was provided an opportunity to ask questions and all were answered. The patient agreed with the plan and demonstrated an understanding of the instructions.   The patient was advised to call back or seek an in-person evaluation if the symptoms worsen or if the condition fails to improve as anticipated.  I provided 14 minutes of non-face-to-face time during this encounter.  Patient reports he has [employer].  Discussed that he should contact the office to update this information.   [name], MD

## 2020-02-11 ENCOUNTER — Other Ambulatory Visit: Payer: Self-pay | Admitting: Family Medicine

## 2020-02-24 ENCOUNTER — Encounter: Payer: Self-pay | Admitting: *Deleted

## 2020-02-24 ENCOUNTER — Encounter: Payer: Self-pay | Admitting: Family Medicine

## 2020-02-24 ENCOUNTER — Telehealth: Payer: Self-pay | Admitting: Family Medicine

## 2020-02-24 NOTE — Telephone Encounter (Signed)
Noted. Please mail a letter as well if you were not able to talk to the patient or spouse regarding the referral. Thanks.

## 2020-02-24 NOTE — Telephone Encounter (Signed)
Letter sent.

## 2020-02-24 NOTE — Telephone Encounter (Signed)
Per ofc-Mailed "unable to contact" letter to home and referring provider.  I called pt and number not in service I also tried to contact spouse number it was in correct.

## 2020-03-07 ENCOUNTER — Telehealth: Payer: Self-pay | Admitting: Family Medicine

## 2020-03-07 NOTE — Telephone Encounter (Signed)
Tried to call pt twice to schedule labs/bp check/ and follow up and there is no vm set up

## 2020-03-24 ENCOUNTER — Other Ambulatory Visit: Payer: Self-pay | Admitting: Family Medicine

## 2020-04-21 ENCOUNTER — Telehealth (INDEPENDENT_AMBULATORY_CARE_PROVIDER_SITE_OTHER): Payer: Self-pay | Admitting: Gastroenterology

## 2020-04-21 ENCOUNTER — Other Ambulatory Visit: Payer: Self-pay

## 2020-04-21 ENCOUNTER — Encounter: Payer: Self-pay | Admitting: Gastroenterology

## 2020-04-21 ENCOUNTER — Encounter: Payer: Self-pay | Admitting: Family Medicine

## 2020-04-21 DIAGNOSIS — Z1211 Encounter for screening for malignant neoplasm of colon: Secondary | ICD-10-CM

## 2020-04-21 NOTE — Progress Notes (Signed)
Gastroenterology Pre-Procedure Review  Request Date: Friday 05/12/20  Requesting Physician: Dr. Marius Ditch  PATIENT REVIEW QUESTIONS: The patient responded to the following health history questions as indicated:    1. Are you having any GI issues? yes (occasional stomach ache, history of hemorrhoids) 2. Do you have a personal history of Polyps? no 3. Do you have a family history of Colon Cancer or Polyps? no 4. Diabetes Mellitus? yes 5. Joint replacements in the past 12 months?no 6. Major health problems in the past 3 months?no 7. Any artificial heart valves, MVP, or defibrillator?no    MEDICATIONS & ALLERGIES:    Patient reports the following regarding taking any anticoagulation/antiplatelet therapy:   Plavix, Coumadin, Eliquis, Xarelto, Lovenox, Pradaxa, Brilinta, or Effient? no Aspirin? no  Patient confirms/reports the following medications:  Current Outpatient Medications  Medication Sig Dispense Refill  . ANUCORT-HC 25 MG suppository INSERT 1 SUPPOSITORY RECTALLY ONCE DAILY    . atorvastatin (LIPITOR) 40 MG tablet Take 1 tablet (40 mg total) by mouth daily. 90 tablet 1  . diltiazem (CARDIZEM CD) 240 MG 24 hr capsule Take 1 capsule by mouth once daily 90 capsule 0  . glucose blood test strip Use as instructed with One Touch Verio to test blood sugars twice daily. Dx: E11.9 100 each 5  . hydrochlorothiazide (HYDRODIURIL) 12.5 MG tablet Take 1 tablet by mouth once daily 90 tablet 0  . losartan (COZAAR) 100 MG tablet Take 1 tablet by mouth once daily 90 tablet 0  . PROCTOZONE-HC 2.5 % rectal cream APPLY TO THE SKIN DAILY OR AFTER EACH BOWEL MOVEMENT     No current facility-administered medications for this visit.    Patient confirms/reports the following allergies:  Allergies  Allergen Reactions  . Lisinopril Cough    cough cough  . Metformin Diarrhea    diarrhea  . Metformin And Related     diarrhea    No orders of the defined types were placed in this  encounter.   AUTHORIZATION INFORMATION Primary Insurance: 1D#: Group #:  Secondary Insurance: 1D#: Group #:  SCHEDULE INFORMATION: Date: Friday 05/12/20 Time: Location:[facility]

## 2020-05-10 ENCOUNTER — Other Ambulatory Visit
Admission: RE | Admit: 2020-05-10 | Discharge: 2020-05-10 | Disposition: A | Discharge status: Home / Self Care | Payer: BC Managed Care – PPO | Source: Ambulatory Visit | Attending: Gastroenterology | Admitting: Gastroenterology

## 2020-05-10 ENCOUNTER — Other Ambulatory Visit: Payer: Self-pay

## 2020-05-10 DIAGNOSIS — Z20822 Contact with and (suspected) exposure to covid-19: Secondary | ICD-10-CM | POA: Diagnosis not present

## 2020-05-10 DIAGNOSIS — Z01812 Encounter for preprocedural laboratory examination: Secondary | ICD-10-CM | POA: Insufficient documentation

## 2020-05-10 LAB — SARS CORONAVIRUS 2 (TAT 6-24 HRS): SARS Coronavirus 2: NEGATIVE

## 2020-05-11 ENCOUNTER — Encounter: Payer: Self-pay | Admitting: Gastroenterology

## 2020-05-12 ENCOUNTER — Ambulatory Visit
Admission: RE | Admit: 2020-05-12 | Discharge: 2020-05-12 | Disposition: A | Discharge status: Home / Self Care | Payer: BC Managed Care – PPO | Attending: Gastroenterology | Admitting: Gastroenterology

## 2020-05-12 ENCOUNTER — Encounter: Payer: Self-pay | Admitting: Gastroenterology

## 2020-05-12 ENCOUNTER — Other Ambulatory Visit: Payer: Self-pay

## 2020-05-12 ENCOUNTER — Ambulatory Visit: Payer: BC Managed Care – PPO | Admitting: Anesthesiology

## 2020-05-12 ENCOUNTER — Encounter
Admission: RE | Disposition: A | Discharge status: Home / Self Care | Payer: Self-pay | Source: Home / Self Care | Attending: Gastroenterology

## 2020-05-12 DIAGNOSIS — Z888 Allergy status to other drugs, medicaments and biological substances status: Secondary | ICD-10-CM | POA: Insufficient documentation

## 2020-05-12 DIAGNOSIS — K635 Polyp of colon: Secondary | ICD-10-CM | POA: Diagnosis not present

## 2020-05-12 DIAGNOSIS — Z833 Family history of diabetes mellitus: Secondary | ICD-10-CM | POA: Insufficient documentation

## 2020-05-12 DIAGNOSIS — G4730 Sleep apnea, unspecified: Secondary | ICD-10-CM | POA: Insufficient documentation

## 2020-05-12 DIAGNOSIS — D124 Benign neoplasm of descending colon: Secondary | ICD-10-CM | POA: Diagnosis not present

## 2020-05-12 DIAGNOSIS — D125 Benign neoplasm of sigmoid colon: Secondary | ICD-10-CM | POA: Diagnosis not present

## 2020-05-12 DIAGNOSIS — Z79899 Other long term (current) drug therapy: Secondary | ICD-10-CM | POA: Diagnosis not present

## 2020-05-12 DIAGNOSIS — I10 Essential (primary) hypertension: Secondary | ICD-10-CM | POA: Insufficient documentation

## 2020-05-12 DIAGNOSIS — K5730 Diverticulosis of large intestine without perforation or abscess without bleeding: Secondary | ICD-10-CM | POA: Insufficient documentation

## 2020-05-12 DIAGNOSIS — F17210 Nicotine dependence, cigarettes, uncomplicated: Secondary | ICD-10-CM | POA: Diagnosis not present

## 2020-05-12 DIAGNOSIS — Z1211 Encounter for screening for malignant neoplasm of colon: Secondary | ICD-10-CM

## 2020-05-12 DIAGNOSIS — K5790 Diverticulosis of intestine, part unspecified, without perforation or abscess without bleeding: Secondary | ICD-10-CM | POA: Diagnosis not present

## 2020-05-12 DIAGNOSIS — Z8249 Family history of ischemic heart disease and other diseases of the circulatory system: Secondary | ICD-10-CM | POA: Diagnosis not present

## 2020-05-12 DIAGNOSIS — E119 Type 2 diabetes mellitus without complications: Secondary | ICD-10-CM | POA: Diagnosis not present

## 2020-05-12 HISTORY — PX: COLONOSCOPY WITH PROPOFOL: SHX5780

## 2020-05-12 SURGERY — COLONOSCOPY WITH PROPOFOL
Anesthesia: General

## 2020-05-12 MED ORDER — LIDOCAINE HCL (PF) 2 % IJ SOLN
INTRAMUSCULAR | Status: AC
  Filled 2020-05-12: qty 5

## 2020-05-12 MED ORDER — FENTANYL CITRATE (PF) 100 MCG/2ML IJ SOLN
INTRAMUSCULAR | Status: DC | PRN
  Administered 2020-05-12: 50 ug via INTRAVENOUS

## 2020-05-12 MED ORDER — FENTANYL CITRATE (PF) 100 MCG/2ML IJ SOLN
INTRAMUSCULAR | Status: AC
  Filled 2020-05-12: qty 2

## 2020-05-12 MED ORDER — PROPOFOL 10 MG/ML IV BOLUS
INTRAVENOUS | Status: AC
  Filled 2020-05-12: qty 20

## 2020-05-12 MED ORDER — SODIUM CHLORIDE 0.9 % IV SOLN: INTRAVENOUS | Status: DC

## 2020-05-12 MED ORDER — MIDAZOLAM HCL 2 MG/2ML IJ SOLN
INTRAMUSCULAR | Status: AC
  Filled 2020-05-12: qty 2

## 2020-05-12 MED ORDER — LIDOCAINE HCL (CARDIAC) PF 100 MG/5ML IV SOSY
PREFILLED_SYRINGE | INTRAVENOUS | Status: DC | PRN
Start: 2020-05-12 — End: 2020-05-12
  Administered 2020-05-12: 80 mg via INTRAVENOUS

## 2020-05-12 MED ORDER — PROPOFOL 500 MG/50ML IV EMUL
INTRAVENOUS | Status: DC | PRN
Start: 2020-05-12 — End: 2020-05-12
  Administered 2020-05-12: 150 ug/kg/min via INTRAVENOUS

## 2020-05-12 MED ORDER — PROPOFOL 10 MG/ML IV BOLUS
INTRAVENOUS | Status: DC | PRN
  Administered 2020-05-12: 50 mg via INTRAVENOUS

## 2020-05-12 MED ORDER — MIDAZOLAM HCL 2 MG/2ML IJ SOLN
INTRAMUSCULAR | Status: DC | PRN
  Administered 2020-05-12: 1 mg via INTRAVENOUS

## 2020-05-12 MED ORDER — PROPOFOL 500 MG/50ML IV EMUL
INTRAVENOUS | Status: AC
  Filled 2020-05-12: qty 50

## 2020-05-12 NOTE — Transfer of Care (Signed)
Immediate Anesthesia Transfer of Care Note  Patient: Eric Stevens  Procedure(s) Performed: COLONOSCOPY WITH PROPOFOL (N/A )  Patient Location: PACU  Anesthesia Type:General  Level of Consciousness: sedated  Airway & Oxygen Therapy: Patient Spontanous Breathing and Patient connected to nasal cannula oxygen  Post-op Assessment: Report given to RN and Post -op Vital signs reviewed and stable  Post vital signs: Reviewed and stable  Last Vitals:  Vitals Value Taken Time  BP 111/69 05/12/20 0842  Temp 35.9 C 05/12/20 0842  Pulse 74 05/12/20 0845  Resp 17 05/12/20 0845  SpO2 99 % 05/12/20 0845  Vitals shown include unvalidated device data.  Last Pain:  Vitals:   05/12/20 0842  TempSrc: Temporal  PainSc: Asleep         Complications: No complications documented.

## 2020-05-12 NOTE — Anesthesia Procedure Notes (Signed)
Date/Time: 05/12/2020 8:08 AM Performed by: Allean Found, CRNA Pre-anesthesia Checklist: Patient identified, Emergency Drugs available, Suction available, Patient being monitored and Timeout performed Patient Re-evaluated:Patient Re-evaluated prior to induction Oxygen Delivery Method: Nasal cannula Placement Confirmation: positive ETCO2

## 2020-05-12 NOTE — H&P (Signed)
[name], MD [address]  [place]  [place], [place] [address]  Main: [phone]  Fax: [phone] Pager: [phone]  Primary Care Physician:  [name], MD Primary Gastroenterologist:  Dr. [name]  Pre-Procedure History & Physical: HPI:  Eric Stevens is a 49 y.o. male is here for an colonoscopy.   Past Medical History:  Diagnosis Date  . Hypertension   . Prostatitis    Dr. Eliberto Ivory  . Sleep apnea     Past Surgical History:  Procedure Laterality Date  . COLONOSCOPY      Prior to Admission medications   Medication Sig Start Date End Date Taking? Authorizing Provider  atorvastatin (LIPITOR) 40 MG tablet Take 1 tablet (40 mg total) by mouth daily. 02/04/20  Yes [name], MD  diltiazem (CARDIZEM CD) 240 MG 24 hr capsule Take 1 capsule by mouth once daily 03/24/20  Yes [name], MD  hydrochlorothiazide (HYDRODIURIL) 12.5 MG tablet Take 1 tablet by mouth once daily 02/11/20  Yes [name], MD  losartan (COZAAR) 100 MG tablet Take 1 tablet by mouth once daily 02/11/20  Yes [name], MD  ANUCORT-HC 25 MG suppository INSERT 1 SUPPOSITORY RECTALLY ONCE DAILY 07/21/19   [provider]  glucose blood test strip Use as instructed with One Touch Verio to test blood sugars twice daily. Dx: E11.9 01/31/16   [name], MD  PROCTOZONE-HC 2.5 % rectal cream APPLY TO THE SKIN DAILY OR AFTER [facility] BOWEL MOVEMENT 07/12/19   [provider]    Allergies as of 04/21/2020 - Review Complete 04/21/2020  Allergen Reaction Noted  . Lisinopril Cough 02/15/2013  . Metformin Diarrhea 05/19/2014  . Metformin and related  05/19/2014    Family History  Problem Relation Age of Onset  . Hypertension Father   . Cancer Father        prostate, brain  . Diabetes Brother   . Aneurysm Mother        brain  . Hypertension Mother     Social History   Socioeconomic History  . Marital status: Married    Spouse  name: Not on file  . Number of children: Not on file  . Years of education: Not on file  . Highest education level: Not on file  Occupational History  . Not on file  Tobacco Use  . Smoking status: Current Some Day Smoker    Years: 5.00    Types: Cigarettes  . Smokeless tobacco: Never Used  Vaping Use  . Vaping Use: Every day  Substance and Sexual Activity  . Alcohol use: Yes    Comment: occassionally none last 2 days  . Drug use: No  . Sexual activity: Not on file  Other Topics Concern  . Not on file  Social History Narrative   Lives with wife in [place]. Children, [place] and 14YO.      Work - Biomedical scientist truck      Diet - regular, limited red meat      Exercise - occasionally   Social Determinants of Radio broadcast assistant Strain:   . Difficulty of Paying Living Expenses:   Food Insecurity:   . Worried About Charity fundraiser in the Last Year:   . Arboriculturist in the Last Year:   Transportation Needs:   . Film/video editor (Medical):   Marland Kitchen Lack of Transportation (Non-Medical):   Physical Activity:   . Days of  Exercise per Week:   . Minutes of Exercise per Session:   Stress:   . Feeling of Stress :   Social Connections:   . Frequency of Communication with Friends and Family:   . Frequency of Social Gatherings with Friends and Family:   . Attends Religious Services:   . Active Member of Clubs or Organizations:   . Attends Archivist Meetings:   Marland Kitchen Marital Status:   Intimate Partner Violence:   . Fear of Current or Ex-Partner:   . Emotionally Abused:   Marland Kitchen Physically Abused:   . Sexually Abused:     Review of Systems: See HPI, otherwise negative ROS  Physical Exam: BP (!) 137/95   Pulse 83   Temp (!) 96.7 F (35.9 C) (Temporal)   Resp 20   Ht 6\' 3"  (1.905 m)   Wt 134.3 kg   SpO2 100%   BMI 37.00 kg/m  General:   Alert,  pleasant and cooperative in NAD Head:  Normocephalic and atraumatic. Neck:  Supple; no masses or  thyromegaly. Lungs:  Clear throughout to auscultation.    Heart:  Regular rate and rhythm. Abdomen:  Soft, nontender and nondistended. Normal bowel sounds, without guarding, and without rebound.   Neurologic:  Alert and  oriented x4;  grossly normal neurologically.  Impression/Plan: Eric Stevens is here for an colonoscopy to be performed for colon cancer screening  Risks, benefits, limitations, and alternatives regarding  colonoscopy have been reviewed with the patient.  Questions have been answered.  All parties agreeable.   Sherri Sear, MD  05/12/2020, 8:06 AM

## 2020-05-12 NOTE — Op Note (Signed)
[facility] Gastroenterology Patient Name: Tyrees Chopin Procedure Date: 05/12/2020 8:08 AM MRN: [id number] Account #: [id number] Date of Birth: 1971/09/29 Admit Type: Outpatient Age: 49 Room: [facility] ENDO ROOM 3 Gender: Male Note Status: Finalized Procedure:             Colonoscopy Indications:           Screening for colorectal malignant neoplasm Providers:             Lin Landsman MD, MD Referring MD:          [name]. [name] (Referring MD) Medicines:             Monitored Anesthesia Care Complications:         No immediate complications. Estimated blood loss: None. Procedure:             Pre-Anesthesia Assessment:                        - Prior to the procedure, a History and Physical was                         performed, and patient medications and allergies were                         reviewed. The patient is competent. The risks and                         benefits of the procedure and the sedation options and                         risks were discussed with the patient. All questions                         were answered and informed consent was obtained.                         Patient identification and proposed procedure were                         verified by the physician, the nurse, the                         anesthesiologist, the anesthetist and the technician                         in the pre-procedure area in the procedure room in the                         endoscopy suite. Airway Examination: normal                         oropharyngeal airway and neck mobility. Respiratory                         Examination: clear to auscultation. CV Examination:                         normal. Prophylactic Antibiotics: The patient does not  require prophylactic antibiotics. Prior                         Anticoagulants: The patient has taken no previous                         anticoagulant or antiplatelet agents. ASA Grade                          Assessment: II - A patient with mild systemic disease.                         After reviewing the risks and benefits, the patient                         was deemed in satisfactory condition to undergo the                         procedure. The anesthesia plan was to use monitored                         anesthesia care (MAC). Immediately prior to                         administration of medications, the patient was                         re-assessed for adequacy to receive sedatives. The                         heart rate, respiratory rate, oxygen saturations,                         blood pressure, adequacy of pulmonary ventilation, and                         response to care were monitored throughout the                         procedure. The physical status of the patient was                         re-assessed after the procedure.                        After obtaining informed consent, the colonoscope was                         passed under direct vision. Throughout the procedure,                         the patient's blood pressure, pulse, and oxygen                         saturations were monitored continuously. The                         Colonoscope was introduced through the anus and  advanced to the the cecum, identified by appendiceal                         orifice and ileocecal valve. The colonoscopy was                         performed without difficulty. The patient tolerated                         the procedure well. The quality of the bowel                         preparation was evaluated using the BBPS [facility] Bowel                         Preparation Scale) with scores of: Right Colon = 3,                         Transverse Colon = 3 and Left Colon = 3 (entire mucosa                         seen well with no residual staining, small fragments                         of stool or opaque liquid). The total BBPS score                          equals 9. Findings:      The perianal and digital rectal examinations were normal. Pertinent       negatives include normal sphincter tone and no palpable rectal lesions.      Two sessile polyps were found in the sigmoid colon and descending colon.       The polyps were 6 to 7 mm in size. These polyps were removed with a cold       snare. Resection and retrieval were complete.      Multiple diverticula were found in the recto-sigmoid colon and sigmoid       colon.      The retroflexed view of the distal rectum and anal verge was normal and       showed no anal or rectal abnormalities. Impression:            - Two 6 to 7 mm polyps in the sigmoid colon and in the                         descending colon, removed with a cold snare. Resected                         and retrieved.                        - Diverticulosis in the recto-sigmoid colon and in the                         sigmoid colon.                        - The distal rectum and anal verge are  normal on                         retroflexion view. Recommendation:        - Discharge patient to home (with escort).                        - Resume previous diet today.                        - Continue present medications.                        - Await pathology results.                        - Repeat colonoscopy in 5 years for surveillance. Procedure Code(s):     --- Professional ---                        [phone], Colonoscopy, flexible; with removal of                         tumor(s), polyp(s), or other lesion(s) by snare                         technique Diagnosis Code(s):     --- Professional ---                        Z12.11, Encounter for screening for malignant neoplasm                         of colon                        K57.30, Diverticulosis of large intestine without                         perforation or abscess without bleeding                        K63.5, Polyp of colon CPT copyright 2019  American Medical Association. All rights reserved. The codes documented in this report are preliminary and upon coder review may  be revised to meet current compliance requirements. Dr. Ulyess Mort Lin Landsman MD, MD 05/12/2020 8:34:14 AM This report has been signed electronically. Number of Addenda: 0 Note Initiated On: 05/12/2020 8:08 AM Scope Withdrawal Time: 0 hours 10 minutes 57 seconds  Total Procedure Duration: 0 hours 15 minutes 38 seconds  Estimated Blood Loss:  Estimated blood loss: none.      [facility]

## 2020-05-12 NOTE — Anesthesia Preprocedure Evaluation (Signed)
Anesthesia Evaluation  Patient identified by MRN, date of birth, ID band Patient awake    Reviewed: Allergy & Precautions, H&P , NPO status , Patient's Chart, lab work & pertinent test results, reviewed documented beta blocker date and time   History of Anesthesia Complications Negative for: history of anesthetic complications  Airway Mallampati: I  TM Distance: >3 FB Neck ROM: full    Dental  (+) Dental Advidsory Given   Pulmonary neg shortness of breath, sleep apnea , neg COPD, neg recent URI, Current Smoker and Patient abstained from smoking.,    Pulmonary exam normal breath sounds clear to auscultation       Cardiovascular Exercise Tolerance: Good hypertension, (-) angina(-) Past MI and (-) Cardiac Stents negative cardio ROS Normal cardiovascular exam(-) dysrhythmias (-) Valvular Problems/Murmurs Rhythm:regular Rate:Normal     Neuro/Psych negative neurological ROS  negative psych ROS   GI/Hepatic Neg liver ROS, GERD  ,  Endo/Other  diabetes, Well Controlled  Renal/GU negative Renal ROS  negative genitourinary   Musculoskeletal   Abdominal   Peds  Hematology negative hematology ROS (+)   Anesthesia Other Findings Past Medical History: No date: Hypertension No date: Prostatitis     Comment:  Dr. Eliberto Ivory No date: Sleep apnea   Reproductive/Obstetrics negative OB ROS                             Anesthesia Physical Anesthesia Plan  ASA: II  Anesthesia Plan: General   Post-op Pain Management:    Induction: Intravenous  PONV Risk Score and Plan: Propofol infusion and TIVA  Airway Management Planned: Natural Airway and Nasal Cannula  Additional Equipment:   Intra-op Plan:   Post-operative Plan:   Informed Consent: I have reviewed the patients History and Physical, chart, labs and discussed the procedure including the risks, benefits and alternatives for the proposed anesthesia  with the patient or authorized representative who has indicated his/her understanding and acceptance.     Dental Advisory Given  Plan Discussed with: Anesthesiologist, CRNA and Surgeon  Anesthesia Plan Comments:         Anesthesia Quick Evaluation

## 2020-05-12 NOTE — Anesthesia Postprocedure Evaluation (Signed)
Anesthesia Post Note  Patient: CUNG MASTERSON  Procedure(s) Performed: COLONOSCOPY WITH PROPOFOL (N/A )  Patient location during evaluation: Endoscopy Anesthesia Type: General Level of consciousness: awake and alert Pain management: pain level controlled Vital Signs Assessment: post-procedure vital signs reviewed and stable Respiratory status: spontaneous breathing, nonlabored ventilation, respiratory function stable and patient connected to nasal cannula oxygen Cardiovascular status: blood pressure returned to baseline and stable Postop Assessment: no apparent nausea or vomiting Anesthetic complications: no   No complications documented.   Last Vitals:  Vitals:   05/12/20 0902 05/12/20 0912  BP: (!) 129/95 (!) 136/93  Pulse: 71 77  Resp: 16 20  Temp:    SpO2: 97% 98%    Last Pain:  Vitals:   05/12/20 0912  TempSrc:   PainSc: 0-No pain                 Martha Clan

## 2020-05-15 ENCOUNTER — Encounter: Payer: Self-pay | Admitting: Gastroenterology

## 2020-05-15 LAB — SURGICAL PATHOLOGY

## 2020-05-16 ENCOUNTER — Encounter: Payer: Self-pay | Admitting: Gastroenterology

## 2020-05-25 ENCOUNTER — Telehealth: Payer: Self-pay

## 2020-05-25 ENCOUNTER — Other Ambulatory Visit (INDEPENDENT_AMBULATORY_CARE_PROVIDER_SITE_OTHER): Payer: BC Managed Care – PPO

## 2020-05-25 ENCOUNTER — Other Ambulatory Visit: Payer: Self-pay

## 2020-05-25 DIAGNOSIS — I10 Essential (primary) hypertension: Secondary | ICD-10-CM | POA: Diagnosis not present

## 2020-05-25 DIAGNOSIS — E119 Type 2 diabetes mellitus without complications: Secondary | ICD-10-CM

## 2020-05-25 LAB — BASIC METABOLIC PANEL
BUN: 15 mg/dL (ref 6–23)
CO2: 30 mEq/L (ref 19–32)
Calcium: 9.7 mg/dL (ref 8.4–10.5)
Chloride: 102 mEq/L (ref 96–112)
Creatinine, Ser: 1.05 mg/dL (ref 0.40–1.50)
GFR: 91.37 mL/min (ref >60.00)
Glucose, Bld: 121 mg/dL — ABNORMAL HIGH (ref 70–99)
Potassium: 4.6 mEq/L (ref 3.5–5.1)
Sodium: 138 mEq/L (ref 135–145)

## 2020-05-25 LAB — HEMOGLOBIN A1C: Hgb A1c MFr Bld: 6.4 % (ref 4.6–6.5)

## 2020-05-26 NOTE — Telephone Encounter (Signed)
Noted. Plan to see as scheduled and can determine if a letter to the DOT is appropriate at that time.

## 2020-06-06 ENCOUNTER — Ambulatory Visit: Payer: BC Managed Care – PPO | Admitting: Family Medicine

## 2020-06-06 ENCOUNTER — Other Ambulatory Visit: Payer: Self-pay

## 2020-06-06 ENCOUNTER — Encounter: Payer: Self-pay | Admitting: Family Medicine

## 2020-06-06 DIAGNOSIS — R1013 Epigastric pain: Secondary | ICD-10-CM | POA: Diagnosis not present

## 2020-06-06 DIAGNOSIS — E785 Hyperlipidemia, unspecified: Secondary | ICD-10-CM | POA: Diagnosis not present

## 2020-06-06 DIAGNOSIS — I10 Essential (primary) hypertension: Secondary | ICD-10-CM | POA: Diagnosis not present

## 2020-06-06 DIAGNOSIS — E119 Type 2 diabetes mellitus without complications: Secondary | ICD-10-CM

## 2020-06-06 MED ORDER — HYDROCHLOROTHIAZIDE 25 MG PO TABS: 25.0000 mg | ORAL_TABLET | Freq: Every day | ORAL | 1 refills | Status: DC

## 2020-06-06 NOTE — Patient Instructions (Signed)
Nice to see you. We are going to increase your hydrochlorothiazide to 25 mg once daily.  You can use two of your 12.5 mg tablets once daily until you run out of your current supply and then pick up the new prescription at the pharmacy which will be 1 tablet daily.   Please continue to work on your diet changes and activity. Please progressively cut down on your alcohol intake.  If you continue to have issues with stomach discomfort despite cutting down on this please let us know and we can have you see GI.

## 2020-06-06 NOTE — Progress Notes (Signed)
Eric Rumps, MD Phone: [phone]  JERMARIO KALMAR is a 49 y.o. male who presents today for f/u.  HYPERTENSION  Disease Monitoring  Home BP Monitoring not checking due to cuff not working Chest pain- no    Dyspnea- no Medications  Compliance-  Taking HCTZ, losartan, diltiazem.  Edema- no  DIABETES Disease Monitoring: Blood Sugar ranges-not checking as he is not on medication Polyuria/phagia/dipsia- no      Optho- due Medications: Compliance- no meds Patient has a very active job.  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  He is work significantly on his diet and has cut soda out.  He is been doing keto with mostly lean meats.  Hyperlipidemia: Taking Lipitor.  No right upper quadrant pain.  No myalgias.  Epigastric pain: Notes this is only occasional.  Typically occurs after he drinks alcohol.  Typically only drinks on the weekend.  Has been working on cutting on this.  Sometimes he does not drink very much and other times he drinks more.  No blood in the stool.  No dysphagia.     Social History   Tobacco Use  Smoking Status Current Some Day Smoker  . Years: 5.00  . Types: Cigarettes  Smokeless Tobacco Never Used     ROS see history of present illness  Objective  Physical Exam Vitals:   06/06/20 0812  BP: 120/85  Pulse: 77  Temp: 98.4 F (36.9 C)  SpO2: 98%    BP Readings from Last 3 Encounters:  06/06/20 120/85  05/12/20 (!) 136/93  12/01/17 120/90   Wt Readings from Last 3 Encounters:  06/06/20 (!) 308 lb 3.2 oz (139.8 kg)  05/12/20 296 lb (134.3 kg)  02/04/20 296 lb (134.3 kg)    Physical Exam Constitutional:      General: He is not in acute distress.    Appearance: He is not diaphoretic.  Cardiovascular:     Rate and Rhythm: Normal rate and regular rhythm.     Heart sounds: Normal heart sounds.  Pulmonary:     Effort: Pulmonary effort is normal.     Breath sounds: Normal breath sounds.  Abdominal:     General: Bowel sounds are normal. There is  no distension.     Palpations: Abdomen is soft.     Tenderness: There is no abdominal tenderness. There is no guarding or rebound.  Musculoskeletal:     Right lower leg: No edema.     Left lower leg: No edema.  Skin:    General: Skin is warm and dry.  Neurological:     Mental Status: He is alert.    Diabetic Foot Exam - Simple   Simple Foot Form Diabetic Foot exam was performed with the following findings: Yes 06/06/2020  8:28 AM  Visual Inspection See comments: Yes Sensation Testing Intact to touch and monofilament testing bilaterally: Yes Pulse Check Posterior Tibialis and Dorsalis pulse intact bilaterally: Yes Comments Onychomycosis bilateral toes, callus over the first MTP joint left foot, dry skin bilateral feet, no ulcerations or skin breakdown       Assessment/Plan: Please see individual problem list.  Diabetes mellitus, type II Very well controlled with diet and activity.  Discussed at this point he does not absolutely need any medicine for this.  He will continue with dietary changes and exercise.  I encouraged him to see ophthalmology for yearly exam.  Follow-up in 3 months.  Hyperlipidemia Continue Lipitor.  Plan to check at next visit.  Epigastric pain Occasional issues with  this related to intake of alcohol.  Suspect he is having some gastritis related to this.  Discussed cutting down on alcohol intake.  If this is persisting despite cutting down on this he may need an EGD and to see GI again.  Hypertension Just above goal.  Discussed goal of less than 130/80.  We will increase his HCTZ to 25 mg.  He will continue losartan and diltiazem.  He will return in 1 month for BP check and labs.   Health Maintenance: Patient declines COVID-19 vaccine.  I encouraged him to get this if he was willing.  Discussed that currently the people that are ending of dying are in the hospital typically patients who have been unvaccinated.  No orders of the defined types were placed  in this encounter.   Meds ordered this encounter  Medications  . hydrochlorothiazide (HYDRODIURIL) 25 MG tablet    Sig: Take 1 tablet (25 mg total) by mouth daily.    Dispense:  90 tablet    Refill:  1    This visit occurred during the SARS-CoV-2 public health emergency.  Safety protocols were in place, including screening questions prior to the visit, additional usage of staff PPE, and extensive cleaning of exam room while observing appropriate contact time as indicated for disinfecting solutions.    Eric Rumps, MD [place]

## 2020-06-06 NOTE — Assessment & Plan Note (Signed)
Very well controlled with diet and activity.  Discussed at this point he does not absolutely need any medicine for this.  He will continue with dietary changes and exercise.  I encouraged him to see ophthalmology for yearly exam.  Follow-up in 3 months.

## 2020-06-06 NOTE — Assessment & Plan Note (Signed)
Just above goal.  Discussed goal of less than 130/80.  We will increase his HCTZ to 25 mg.  He will continue losartan and diltiazem.  He will return in 1 month for BP check and labs.

## 2020-06-06 NOTE — Assessment & Plan Note (Signed)
Occasional issues with this related to intake of alcohol.  Suspect he is having some gastritis related to this.  Discussed cutting down on alcohol intake.  If this is persisting despite cutting down on this he may need an EGD and to see GI again.

## 2020-06-06 NOTE — Assessment & Plan Note (Signed)
Continue Lipitor.  Plan to check at next visit.

## 2020-06-14 ENCOUNTER — Other Ambulatory Visit: Payer: Self-pay | Admitting: Family Medicine

## 2020-07-03 ENCOUNTER — Other Ambulatory Visit: Payer: Self-pay | Admitting: Family Medicine

## 2020-07-06 ENCOUNTER — Other Ambulatory Visit: Payer: Self-pay

## 2020-07-07 ENCOUNTER — Encounter: Payer: Self-pay | Admitting: Family Medicine

## 2020-07-07 ENCOUNTER — Ambulatory Visit: Payer: BC Managed Care – PPO | Admitting: Family Medicine

## 2020-07-07 ENCOUNTER — Other Ambulatory Visit: Payer: Self-pay

## 2020-07-07 DIAGNOSIS — J302 Other seasonal allergic rhinitis: Secondary | ICD-10-CM | POA: Diagnosis not present

## 2020-07-07 DIAGNOSIS — I10 Essential (primary) hypertension: Secondary | ICD-10-CM | POA: Diagnosis not present

## 2020-07-07 LAB — BASIC METABOLIC PANEL
BUN: 23 mg/dL (ref 6–23)
CO2: 27 mEq/L (ref 19–32)
Calcium: 10.1 mg/dL (ref 8.4–10.5)
Chloride: 105 mEq/L (ref 96–112)
Creatinine, Ser: 1.2 mg/dL (ref 0.40–1.50)
GFR: 78.28 mL/min (ref >60.00)
Glucose, Bld: 127 mg/dL — ABNORMAL HIGH (ref 70–99)
Potassium: 3.8 mEq/L (ref 3.5–5.1)
Sodium: 142 mEq/L (ref 135–145)

## 2020-07-07 MED ORDER — FLUTICASONE PROPIONATE 50 MCG/ACT NA SUSP
2.0000 | Freq: Every day | NASAL | 6 refills | Status: DC
Start: 2020-07-07 — End: 2021-08-16

## 2020-07-07 NOTE — Assessment & Plan Note (Signed)
Symptoms consistent with allergies.  We will give him a trial of Flonase.

## 2020-07-07 NOTE — Patient Instructions (Signed)
Nice to see you. Please continue your current blood pressure medications. We will check labs today. Please try the Flonase for your allergies.

## 2020-07-07 NOTE — Progress Notes (Signed)
[name], MD Phone: [phone]  Eric Stevens is a 49 y.o. male who presents today for f/u.  HYPERTENSION  Disease Monitoring  Home BP Monitoring not checking Chest pain- no    Dyspnea- no Medications  Compliance-  Taking HCTZ, losartan, diltiazem.  Edema- no  Allergic rhinitis: Patient notes he has is intermittently depending on the season.  Its been bothering him for the last 2 to 3 months with congestion and runny nose in the morning.  He will blow his nose and not have any significant symptoms the rest of the day.  No fevers.  Has tried Zyrtec in the past and was on Flonase at one point.    Social History   Tobacco Use  Smoking Status Current Some Day Smoker  . Years: 5.00  . Types: Cigarettes  Smokeless Tobacco Never Used     ROS see history of present illness  Objective  Physical Exam Vitals:   07/07/20 0811  BP: 118/80  Pulse: 78  Temp: 98.3 F (36.8 C)  SpO2: 98%    BP Readings from Last 3 Encounters:  07/07/20 118/80  06/06/20 120/85  05/12/20 (!) 136/93   Wt Readings from Last 3 Encounters:  07/07/20 (!) 304 lb 6.4 oz (138.1 kg)  06/06/20 (!) 308 lb 3.2 oz (139.8 kg)  05/12/20 296 lb (134.3 kg)    Physical Exam Constitutional:      General: He is not in acute distress.    Appearance: He is not diaphoretic.  Cardiovascular:     Rate and Rhythm: Normal rate and regular rhythm.     Heart sounds: Normal heart sounds.  Pulmonary:     Effort: Pulmonary effort is normal.     Breath sounds: Normal breath sounds.  Musculoskeletal:     Right lower leg: No edema.     Left lower leg: No edema.  Skin:    General: Skin is warm and dry.  Neurological:     Mental Status: He is alert.      Assessment/Plan: Please see individual problem list.  Hypertension Improved with the increased dose of HCTZ.  He will continue his current regimen.  Follow-up in 3 months for diabetes and hypertension.  Check BMP today.  Seasonal allergies Symptoms  consistent with allergies.  We will give him a trial of Flonase.   Health Maintenance: Patient reports he had his first Covid vaccine 2 to 3 weeks ago.  He felt poorly for a couple of days and notes he felt so poorly that he would not get the second Covid vaccine.  He had Covid last year.  I discussed with him that the symptoms he had were likely from his body building an immune response.  Discussed that he could have similar symptoms with the second vaccine or he may not.  Advised that he would not have significant protection from a single dose.  I encouraged him to get the second dose of his vaccine as the delta variant is significantly more infectious and can cause worse symptoms in a lot of people.  He continued to note that he would not be getting the second vaccine.  Orders Placed This Encounter  Procedures  . Basic Metabolic Panel (BMET)    Meds ordered this encounter  Medications  . fluticasone (FLONASE) 50 MCG/ACT nasal spray    Sig: Place 2 sprays into both nostrils daily.    Dispense:  16 g    Refill:  6    This visit occurred during the  SARS-CoV-2 public health emergency.  Safety protocols were in place, including screening questions prior to the visit, additional usage of staff PPE, and extensive cleaning of exam room while observing appropriate contact time as indicated for disinfecting solutions.    Aijah Lattner, MD [facility] [place]  

## 2020-07-07 NOTE — Assessment & Plan Note (Signed)
Improved with the increased dose of HCTZ.  He will continue his current regimen.  Follow-up in 3 months for diabetes and hypertension.  Check BMP today.

## 2020-07-15 LAB — HM DIABETES EYE EXAM

## 2020-08-11 ENCOUNTER — Other Ambulatory Visit: Payer: Self-pay | Admitting: Family Medicine

## 2020-10-09 ENCOUNTER — Ambulatory Visit: Payer: BC Managed Care – PPO | Admitting: Family Medicine

## 2020-10-20 ENCOUNTER — Encounter: Payer: Self-pay | Admitting: Family Medicine

## 2020-10-20 ENCOUNTER — Ambulatory Visit: Payer: BC Managed Care – PPO | Admitting: Family Medicine

## 2020-10-20 ENCOUNTER — Other Ambulatory Visit: Payer: Self-pay

## 2020-10-20 VITALS — BP 118/80 | HR 90 | Temp 98.8°F | Ht 74.0 in | Wt 314.2 lb

## 2020-10-20 DIAGNOSIS — I10 Essential (primary) hypertension: Secondary | ICD-10-CM | POA: Diagnosis not present

## 2020-10-20 DIAGNOSIS — Z8042 Family history of malignant neoplasm of prostate: Secondary | ICD-10-CM | POA: Diagnosis not present

## 2020-10-20 DIAGNOSIS — E119 Type 2 diabetes mellitus without complications: Secondary | ICD-10-CM

## 2020-10-20 DIAGNOSIS — E785 Hyperlipidemia, unspecified: Secondary | ICD-10-CM | POA: Diagnosis not present

## 2020-10-20 NOTE — Assessment & Plan Note (Signed)
Check PSA. ?

## 2020-10-20 NOTE — Assessment & Plan Note (Signed)
Adequate control.  He will continue hydrochlorothiazide 25 mg once daily, diltiazem 240 mg once daily, and losartan 100 mg daily.  Check labs.

## 2020-10-20 NOTE — Assessment & Plan Note (Signed)
Check lipid panel.  Continue Lipitor 40 mg once daily. °

## 2020-10-20 NOTE — Patient Instructions (Signed)
Nice to see you. Please continue with healthy diet. Please try to stay as active as you are able to. We will get lab work today and contact you with the results. If you develop persistent hip pain please let me know.

## 2020-10-20 NOTE — Progress Notes (Signed)
Eric Rumps, MD Phone: [phone]  Eric Stevens is a 49 y.o. male who presents today for f/u.  HYPERTENSION Disease Monitoring: Blood pressure range-not checking Chest pain- no      Dyspnea- no Medications: Compliance- taking HCTZ, diltiazem, losartan   Edema- no  DIABETES Disease Monitoring: Blood Sugar ranges-not checking Polyuria/phagia/dipsia- no      Optho- UTD Patient notes he is gotten back to his keto diet.  He is cut out sugary stuff and lots of carbohydrates.  Typically eats lean meats and vegetables.  He is dropped a few pound since restarting this 2 weeks ago.  His work is quite active.  No specific exercise.  HYPERLIPIDEMIA Disease Monitoring: See symptoms for Hypertension Medications: Compliance-taking Lipitor. Right upper quadrant pain-no muscle aches-no  Chronic right hip pain: Patient notes this is an intermittent issue that is been going on for years.  It is not in his hip joint that was in his muscles around his belt line.  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  It is not bothering him currently.   Social History   Tobacco Use  Smoking Status Current Some Day Smoker  . Years: 5.00  . Types: Cigarettes  Smokeless Tobacco Never Used     ROS see history of present illness  Objective  Physical Exam Vitals:   10/20/20 1528  BP: 118/80  Pulse: 90  Temp: 98.8 F (37.1 C)  SpO2: 98%    BP Readings from Last 3 Encounters:  10/20/20 118/80  07/07/20 118/80  06/06/20 120/85   Wt Readings from Last 3 Encounters:  10/20/20 (!) 314 lb 3.2 oz (142.5 kg)  07/07/20 (!) 304 lb 6.4 oz (138.1 kg)  06/06/20 (!) 308 lb 3.2 oz (139.8 kg)    Physical Exam Constitutional:      General: He is not in acute distress.    Appearance: He is not diaphoretic.  Cardiovascular:     Rate and Rhythm: Normal rate and regular rhythm.     Heart sounds: Normal heart sounds.  Pulmonary:     Effort: Pulmonary effort is normal.     Breath sounds:  Normal breath sounds.  Musculoskeletal:     Right lower leg: No edema.     Left lower leg: No edema.     Comments: Right hip with no tenderness, full range of motion internally and externally with no discomfort  Skin:    General: Skin is warm and dry.  Neurological:     Mental Status: He is alert.      Assessment/Plan: Please see individual problem list.  Problem List Items Addressed This Visit    Diabetes mellitus, type II ([place])    Check A1c.  Continue healthy diet.  He will remain active through work.      Relevant Orders   HgB A1c   Family history of prostate cancer in father    Check PSA.      Relevant Orders   PSA   Hyperlipidemia    Check lipid panel.  Continue Lipitor 40 mg once daily.      Relevant Orders   Lipid panel   Hypertension - Primary    Adequate control.  He will continue hydrochlorothiazide 25 mg once daily, diltiazem 240 mg once daily, and losartan 100 mg daily.  Check labs.      Relevant Orders   Comp Met (CMET)      This visit occurred during the SARS-CoV-2 public health emergency.  Safety protocols  were in place, including screening questions prior to the visit, additional usage of staff PPE, and extensive cleaning of exam room while observing appropriate contact time as indicated for disinfecting solutions.    Eric Rumps, MD [place]

## 2020-10-20 NOTE — Assessment & Plan Note (Signed)
Check A1c.  Continue healthy diet.  He will remain active through work.

## 2020-10-21 LAB — COMPREHENSIVE METABOLIC PANEL
AG Ratio: 1.5 (calc) (ref 1.0–2.5)
ALT: 36 U/L (ref 9–46)
AST: 22 U/L (ref 10–40)
Albumin: 4.3 g/dL (ref 3.6–5.1)
Alkaline phosphatase (APISO): 59 U/L (ref 36–130)
BUN: 18 mg/dL (ref 7–25)
CO2: 26 mmol/L (ref 20–32)
Calcium: 10.2 mg/dL (ref 8.6–10.3)
Chloride: 103 mmol/L (ref 98–110)
Creat: 1.18 mg/dL (ref 0.60–1.35)
Globulin: 2.9 g/dL (calc) (ref 1.9–3.7)
Glucose, Bld: 107 mg/dL — ABNORMAL HIGH (ref 65–99)
Potassium: 4.1 mmol/L (ref 3.5–5.3)
Sodium: 140 mmol/L (ref 135–146)
Total Bilirubin: 0.6 mg/dL (ref 0.2–1.2)
Total Protein: 7.2 g/dL (ref 6.1–8.1)

## 2020-10-21 LAB — HEMOGLOBIN A1C
Hgb A1c MFr Bld: 6.6 % of total Hgb — ABNORMAL HIGH (ref <5.7)
Mean Plasma Glucose: 143 (calc)
eAG (mmol/L): 7.9 (calc)

## 2020-10-21 LAB — PSA: PSA: 0.35 ng/mL (ref <=4.0)

## 2020-10-21 LAB — LIPID PANEL
Cholesterol: 114 mg/dL (ref <200)
HDL: 33 mg/dL — ABNORMAL LOW (ref >=40)
LDL Cholesterol (Calc): 53 mg/dL (calc)
Non-HDL Cholesterol (Calc): 81 mg/dL (calc) (ref <130)
Total CHOL/HDL Ratio: 3.5 (calc) (ref <5.0)
Triglycerides: 226 mg/dL — ABNORMAL HIGH (ref <150)

## 2020-10-23 ENCOUNTER — Telehealth: Payer: Self-pay

## 2020-10-23 NOTE — Telephone Encounter (Signed)
Left message on patients vm the results have not been resulted yet and we will give him a call once they have been

## 2020-10-23 NOTE — Telephone Encounter (Signed)
Pt would like a call back about lab results. Pt states that PCP told him if his labs were good, he could wait 6 months before coming back. If not, he will keep his 67m appt for March. Please advise.

## 2020-10-30 ENCOUNTER — Other Ambulatory Visit: Payer: Self-pay | Admitting: Family Medicine

## 2020-10-30 MED ORDER — METFORMIN HCL ER 500 MG PO TB24: 500.0000 mg | ORAL_TABLET | Freq: Every day | ORAL | 2 refills | Status: DC

## 2020-11-14 ENCOUNTER — Other Ambulatory Visit: Payer: Self-pay | Admitting: Family Medicine

## 2020-11-14 DIAGNOSIS — E785 Hyperlipidemia, unspecified: Secondary | ICD-10-CM

## 2020-11-16 ENCOUNTER — Telehealth: Payer: Self-pay | Admitting: Family Medicine

## 2020-11-16 NOTE — Telephone Encounter (Signed)
Patient called in stated that [facility] does not have the dosage oflosartan (COZAAR) 100 MG tablet that he takes wanted to know if they could do a prescription or 50 and he can take two of 50 until they get the 100 in it's on back order

## 2020-11-20 MED ORDER — LOSARTAN POTASSIUM 50 MG PO TABS
100.0000 mg | ORAL_TABLET | Freq: Every day | ORAL | 1 refills | Status: DC
Start: 2020-11-20 — End: 2021-08-02

## 2020-11-20 NOTE — Telephone Encounter (Signed)
Losartan 50 mg tablets sent to the pharmacy.

## 2020-12-24 ENCOUNTER — Other Ambulatory Visit: Payer: Self-pay | Admitting: Family Medicine

## 2020-12-24 DIAGNOSIS — E785 Hyperlipidemia, unspecified: Secondary | ICD-10-CM

## 2021-01-19 ENCOUNTER — Ambulatory Visit: Payer: BC Managed Care – PPO | Admitting: Family Medicine

## 2021-02-03 ENCOUNTER — Other Ambulatory Visit: Payer: Self-pay | Admitting: Family Medicine

## 2021-02-03 DIAGNOSIS — E785 Hyperlipidemia, unspecified: Secondary | ICD-10-CM

## 2021-03-07 ENCOUNTER — Other Ambulatory Visit: Payer: Self-pay | Admitting: Family Medicine

## 2021-03-22 ENCOUNTER — Other Ambulatory Visit: Payer: Self-pay | Admitting: Family Medicine

## 2021-03-22 DIAGNOSIS — E785 Hyperlipidemia, unspecified: Secondary | ICD-10-CM

## 2021-05-01 ENCOUNTER — Encounter: Payer: BC Managed Care – PPO | Admitting: Family Medicine

## 2021-05-09 ENCOUNTER — Other Ambulatory Visit: Payer: Self-pay | Admitting: Family Medicine

## 2021-05-09 DIAGNOSIS — E785 Hyperlipidemia, unspecified: Secondary | ICD-10-CM

## 2021-05-17 ENCOUNTER — Other Ambulatory Visit: Payer: Self-pay | Admitting: Family Medicine

## 2021-06-14 ENCOUNTER — Other Ambulatory Visit: Payer: Self-pay

## 2021-06-14 ENCOUNTER — Ambulatory Visit (INDEPENDENT_AMBULATORY_CARE_PROVIDER_SITE_OTHER): Payer: BC Managed Care – PPO | Admitting: Family Medicine

## 2021-06-14 ENCOUNTER — Encounter: Payer: Self-pay | Admitting: Family Medicine

## 2021-06-14 VITALS — BP 124/80 | HR 86 | Temp 98.3°F | Ht 74.02 in | Wt 323.0 lb

## 2021-06-14 DIAGNOSIS — Z0001 Encounter for general adult medical examination with abnormal findings: Secondary | ICD-10-CM | POA: Diagnosis not present

## 2021-06-14 DIAGNOSIS — I10 Essential (primary) hypertension: Secondary | ICD-10-CM

## 2021-06-14 DIAGNOSIS — E119 Type 2 diabetes mellitus without complications: Secondary | ICD-10-CM | POA: Diagnosis not present

## 2021-06-14 DIAGNOSIS — K649 Unspecified hemorrhoids: Secondary | ICD-10-CM

## 2021-06-14 DIAGNOSIS — F17290 Nicotine dependence, other tobacco product, uncomplicated: Secondary | ICD-10-CM | POA: Diagnosis not present

## 2021-06-14 DIAGNOSIS — Z23 Encounter for immunization: Secondary | ICD-10-CM | POA: Diagnosis not present

## 2021-06-14 LAB — BASIC METABOLIC PANEL
BUN: 17 mg/dL (ref 6–23)
CO2: 25 mEq/L (ref 19–32)
Calcium: 9.9 mg/dL (ref 8.4–10.5)
Chloride: 103 mEq/L (ref 96–112)
Creatinine, Ser: 1.11 mg/dL (ref 0.40–1.50)
GFR: 78.44 mL/min (ref >60.00)
Glucose, Bld: 129 mg/dL — ABNORMAL HIGH (ref 70–99)
Potassium: 3.9 mEq/L (ref 3.5–5.1)
Sodium: 138 mEq/L (ref 135–145)

## 2021-06-14 LAB — HEMOGLOBIN A1C: Hgb A1c MFr Bld: 7.1 % — ABNORMAL HIGH (ref 4.6–6.5)

## 2021-06-14 MED ORDER — HYDROCORTISONE (PERIANAL) 2.5 % EX CREA: 1.0000 "application " | TOPICAL_CREAM | Freq: Two times a day (BID) | CUTANEOUS | 0 refills | Status: DC

## 2021-06-14 MED ORDER — BUPROPION HCL ER (SR) 150 MG PO TB12: ORAL_TABLET | ORAL | 0 refills | Status: DC

## 2021-06-14 NOTE — Assessment & Plan Note (Signed)
Appears to have skin tags likely related to external hemorrhoids.  These have been improving.  He can use topical Anusol.  Discussed adding in a fiber supplement or increasing his fiber in his diet to help prevent having to strain.  Advised not to sit on the toilet for very long but having a bowel movement.  If he has recurrent issues with this we could have him see GI to consider intervention.

## 2021-06-14 NOTE — Assessment & Plan Note (Signed)
Smoking cessation counseling was provided.  Approximately 4 minutes were spent discussing the rationale for tobacco cessation and strategies for doing so.  Adjuncts, including nicotine patches, nicotine lozenges, varenicline and buproprion were recommended. We opted for wellbutrin. Advised to start this one week prior to quit date. Follow-up in 3 months on this. He will monitor for any side effects of this medication.

## 2021-06-14 NOTE — Patient Instructions (Addendum)
Nice to see you. We will get lab work today. Please start on the Wellbutrin.  If you notice any side effects please let us know. You can use the Anusol to help with your hemorrhoids.  Please try to add in a fiber supplement as well.

## 2021-06-14 NOTE — Assessment & Plan Note (Addendum)
Physical exam completed.  I encouraged healthy diet.  Discussed minimizing carbohydrate intake.  Discussed healthier options for his meals.  Encouraged continued activity level.  PSA is up-to-date.  Colonoscopy is up-to-date.  He will be given a tetanus vaccine today.  He declines COVID vaccination.  He will consider the pneumonia vaccine.  He is ready to quit smoking.  We will proceed with Wellbutrin for this.  He denies a history of seizures.  Discussed minimizing alcohol intake and not binge drinking.  Lab work as outlined.  I encouraged increasing his water intake as he is likely not getting enough water with how active he is at his job.

## 2021-06-14 NOTE — Progress Notes (Signed)
[name], MD Phone: [phone]  Eric Stevens is a 50 y.o. male who presents today for CPE.  Diet: whatever he wants, his work schedule limits his options, does eat plenty of fruits and some vegetables Exercise: job is active Colonoscopy: 05/12/20, 5 year recall Prostate cancer screening: UTD Family history-  Prostate cancer: father  Colon cancer: no Vaccines-   Flu: out of season  Tetanus: due  COVID19: declines second dose of vaccine  Pneumonia: due, though defers to next visit HIV screening: UTD Hep C Screening: UTD Tobacco use: occasional cigar, reports he is ready to quit  Alcohol use: occasional on the weekend, unable to quantify amount Illicit Drug use: no Dentist: yes Ophthalmology: yes  Hemorrhoids: Patient notes chronic intermittent issues with hemorrhoids.  Notes a week or so ago he had some straining and notes that set his hemorrhoids off.  Noted discomfort and itching.  This has progressively improved with use of prescription medication and over-the-counter medication.  Notes he has been using a stool softener as well.  Notes he sits for 5 to 10 minutes when having a bowel movement.  No bleeding.  Notes his urine is at times dark yellow when he wakes up in the morning with an odor.  He notes no other urinary symptoms.  Active Ambulatory Problems    Diagnosis Date Noted   Hypertension 12/15/2012   Erectile dysfunction 12/15/2012   Family history of brain aneurysm 12/15/2012   Seasonal allergies 02/15/2013   Epigastric pain 09/10/2013   Elevated liver enzymes 10/04/2013   Diabetes mellitus, type II ([place]) 03/31/2014   Obesity (BMI 30-39.9) 03/31/2014   Unspecified vitamin D deficiency 04/13/2014   GERD (gastroesophageal reflux disease) 01/07/2017   History of rectal bleeding 12/01/2017   Family history of prostate cancer in father 12/01/2017   Fever 04/26/2019   Hyperlipidemia 02/04/2020   Encounter for screening colonoscopy 02/04/2020   Encounter  for general adult medical examination with abnormal findings 06/14/2021   Nicotine dependence, other tobacco product, uncomplicated 0000000   Hemorrhoids 06/14/2021   Resolved Ambulatory Problems    Diagnosis Date Noted   Obstructive sleep apnea 02/05/2013   Left maxillary sinusitis 03/10/2013   Diarrhea 04/14/2013   Rash 06/28/2013   Shortness of breath 04/07/2014   Right wrist pain 04/13/2014   Toenail fungus 05/19/2014   Muscular chest pain 01/31/2016   Viral URI with cough 01/31/2016   Diabetes mellitus without complication ([place]) [id number]   Past Medical History:  Diagnosis Date   Prostatitis    Sleep apnea     Family History  Problem Relation Age of Onset   Hypertension Father    Cancer Father        prostate, brain   Diabetes Brother    Aneurysm Mother        brain   Hypertension Mother     Social History   Socioeconomic History   Marital status: Married    Spouse name: Not on file   Number of children: Not on file   Years of education: Not on file   Highest education level: Not on file  Occupational History   Not on file  Tobacco Use   Smoking status: Some Days    Years: 5.00    Types: Cigarettes   Smokeless tobacco: Never  Vaping Use   Vaping Use: Every day  Substance and Sexual Activity   Alcohol use: Yes    Comment: occassionally none last 2 days   Drug use: No  Sexual activity: Not on file  Other Topics Concern   Not on file  Social History Narrative   Lives with wife in [place]. Children, [place] and 14YO.      Work - Biomedical scientist truck      Diet - regular, limited red meat      Exercise - occasionally   Social Determinants of Radio broadcast assistant Strain: Not on file  Food Insecurity: Not on file  Transportation Needs: Not on file  Physical Activity: Not on file  Stress: Not on file  Social Connections: Not on file  Intimate Partner Violence: Not on file    ROS  General:  Negative for nexplained weight loss,  fever Skin: Negative for new or changing mole, sore that won't heal HEENT: Negative for trouble hearing, trouble seeing, ringing in ears, mouth sores, hoarseness, change in voice, dysphagia. CV:  Negative for chest pain, dyspnea, edema, palpitations Resp: Negative for cough, dyspnea, hemoptysis GI: Negative for nausea, vomiting, diarrhea, constipation, abdominal pain, melena, hematochezia. GU: Negative for dysuria, incontinence, urinary hesitance, hematuria, vaginal or penile discharge, polyuria, sexual difficulty, lumps in testicle or breasts MSK: Positive for muscle cramps or aches, joint pain or swelling Neuro: Negative for headaches, weakness, numbness, dizziness, passing out/fainting Psych: Negative for depression, anxiety, memory problems  Objective  Physical Exam Vitals:   06/14/21 0839  BP: 130/90  Pulse: 86  Temp: 98.3 F (36.8 C)  SpO2: 97%    BP Readings from Last 3 Encounters:  06/14/21 130/90  10/20/20 118/80  07/07/20 118/80   Wt Readings from Last 3 Encounters:  06/14/21 (!) 323 lb (146.5 kg)  10/20/20 (!) 314 lb 3.2 oz (142.5 kg)  07/07/20 (!) 304 lb 6.4 oz (138.1 kg)    Physical Exam Constitutional:      General: He is not in acute distress.    Appearance: He is not diaphoretic.  HENT:     Head: Normocephalic and atraumatic.  Eyes:     Conjunctiva/sclera: Conjunctivae normal.     Pupils: Pupils are equal, round, and reactive to light.  Cardiovascular:     Rate and Rhythm: Normal rate and regular rhythm.     Heart sounds: Normal heart sounds.  Pulmonary:     Effort: Pulmonary effort is normal.     Breath sounds: Normal breath sounds.  Abdominal:     General: Bowel sounds are normal. There is no distension.     Palpations: Abdomen is soft.     Tenderness: There is no abdominal tenderness. There is no guarding or rebound.  Genitourinary:    Comments: External rectal skin tags noted with no thrombosed hemorrhoids, no external bleeding of his  rectum Musculoskeletal:     Right lower leg: No edema.     Left lower leg: No edema.  Lymphadenopathy:     Cervical: No cervical adenopathy.  Skin:    General: Skin is warm and dry.  Neurological:     Mental Status: He is alert.  Psychiatric:        Mood and Affect: Mood normal.     Assessment/Plan:   Problem List Items Addressed This Visit     Diabetes mellitus, type II ([place])   Relevant Orders   HgB A1c   Encounter for general adult medical examination with abnormal findings - Primary    Physical exam completed.  I encouraged healthy diet.  Discussed minimizing carbohydrate intake.  Discussed healthier options for his meals.  Encouraged continued activity level.  PSA is  up-to-date.  Colonoscopy is up-to-date.  He will be given a tetanus vaccine today.  He declines COVID vaccination.  He will consider the pneumonia vaccine.  He is ready to quit smoking.  We will proceed with Wellbutrin for this.  He denies a history of seizures.  Discussed minimizing alcohol intake and not binge drinking.  Lab work as outlined.       Hemorrhoids    Appears to have skin tags likely related to external hemorrhoids.  These have been improving.  He can use topical Anusol.  Discussed adding in a fiber supplement or increasing his fiber in his diet to help prevent having to strain.  Advised not to sit on the toilet for very long but having a bowel movement.  If he has recurrent issues with this we could have him see GI to consider intervention.       Relevant Medications   hydrocortisone (ANUSOL-HC) 2.5 % rectal cream   Hypertension   Relevant Orders   Basic Metabolic Panel (BMET)   Nicotine dependence, other tobacco product, uncomplicated    Smoking cessation counseling was provided.  Approximately 4 minutes were spent discussing the rationale for tobacco cessation and strategies for doing so.  Adjuncts, including nicotine patches, nicotine lozenges, varenicline and buproprion were recommended. We  opted for wellbutrin. Advised to start this one week prior to quit date. Follow-up in 3 months on this. He will monitor for any side effects of this medication.         Relevant Medications   buPROPion (WELLBUTRIN SR) 150 MG 12 hr tablet    Return in about 3 months (around 09/14/2021).  This visit occurred during the SARS-CoV-2 public health emergency.  Safety protocols were in place, including screening questions prior to the visit, additional usage of staff PPE, and extensive cleaning of exam room while observing appropriate contact time as indicated for disinfecting solutions.    [name], MD [place]

## 2021-06-15 ENCOUNTER — Encounter: Payer: BC Managed Care – PPO | Admitting: Family Medicine

## 2021-06-25 ENCOUNTER — Other Ambulatory Visit: Payer: Self-pay | Admitting: Family Medicine

## 2021-06-25 DIAGNOSIS — E785 Hyperlipidemia, unspecified: Secondary | ICD-10-CM

## 2021-07-06 ENCOUNTER — Other Ambulatory Visit: Payer: Self-pay | Admitting: Family Medicine

## 2021-08-01 ENCOUNTER — Telehealth: Payer: Self-pay | Admitting: Family Medicine

## 2021-08-01 DIAGNOSIS — F17290 Nicotine dependence, other tobacco product, uncomplicated: Secondary | ICD-10-CM

## 2021-08-01 DIAGNOSIS — E785 Hyperlipidemia, unspecified: Secondary | ICD-10-CM

## 2021-08-06 NOTE — Telephone Encounter (Signed)
All refills go to [facility], on [address], in [place]. Patient would like a 90 day supply, if insurance pays for it.

## 2021-08-07 MED ORDER — ATORVASTATIN CALCIUM 40 MG PO TABS: 40.0000 mg | ORAL_TABLET | Freq: Every day | ORAL | 1 refills | Status: DC

## 2021-08-07 MED ORDER — HYDROCHLOROTHIAZIDE 25 MG PO TABS: 25.0000 mg | ORAL_TABLET | Freq: Every day | ORAL | 1 refills | Status: DC

## 2021-08-07 MED ORDER — BUPROPION HCL ER (SR) 150 MG PO TB12: 150.0000 mg | ORAL_TABLET | Freq: Two times a day (BID) | ORAL | 1 refills | Status: DC

## 2021-08-07 MED ORDER — DILTIAZEM HCL ER COATED BEADS 240 MG PO CP24: 240.0000 mg | ORAL_CAPSULE | Freq: Every day | ORAL | 1 refills | Status: DC

## 2021-08-07 MED ORDER — LOSARTAN POTASSIUM 50 MG PO TABS: 100.0000 mg | ORAL_TABLET | Freq: Every day | ORAL | 1 refills | Status: DC

## 2021-08-07 MED ORDER — METFORMIN HCL ER 500 MG PO TB24: 500.0000 mg | ORAL_TABLET | Freq: Every day | ORAL | 2 refills | Status: DC

## 2021-08-07 NOTE — Telephone Encounter (Signed)
Called [facility] to discuss exactly which medication needs to be refilled. Left a message to call back.

## 2021-08-07 NOTE — Telephone Encounter (Signed)
Patient calling back in and switching pharmacies. Verified dosage and Patient information.   Medication sent in to preferred pharmacy per protocol.

## 2021-08-07 NOTE — Addendum Note (Signed)
Addended by: Thressa Sheller on: 08/07/2021 04:54 PM   Modules accepted: Orders

## 2021-08-16 ENCOUNTER — Ambulatory Visit (INDEPENDENT_AMBULATORY_CARE_PROVIDER_SITE_OTHER): Payer: BC Managed Care – PPO | Admitting: Physician Assistant

## 2021-08-16 ENCOUNTER — Encounter: Payer: Self-pay | Admitting: Physician Assistant

## 2021-08-16 ENCOUNTER — Telehealth: Payer: Self-pay

## 2021-08-16 ENCOUNTER — Telehealth: Payer: Self-pay | Admitting: Family Medicine

## 2021-08-16 VITALS — Ht 74.0 in

## 2021-08-16 DIAGNOSIS — R42 Dizziness and giddiness: Secondary | ICD-10-CM

## 2021-08-16 NOTE — Telephone Encounter (Signed)
Patient called requesting glucose and BP check. However, due to his symptoms he is unable to come into the practice. His wife will come in to pick up the glucose machine( one touch) and take his BP at home. The patient will need supplies called into the pharmacy for his glucose supplies.

## 2021-08-16 NOTE — Telephone Encounter (Signed)
Pt called in with readings for appt with Alyssa this afternoon:  Glucose Reading: 178 after eating  Blood Pressure: 129/65  Covid Test was negative

## 2021-08-16 NOTE — Progress Notes (Signed)
Virtual Visit via Telephone Note  I connected with Eric Stevens on 08/16/21 at  4:00 PM EDT by telephone and verified that I am speaking with the correct person using two identifiers.  Location: Patient: Home Provider: Therapist, music at [employer]   I discussed the limitations, risks, security and privacy concerns of performing an evaluation and management service by telephone and the availability of in person appointments. I also discussed with the patient that there may be a patient responsible charge related to this service. The patient expressed understanding and agreed to proceed.   History of Present Illness: Pt presents via telephone visit for dizziness starting this morning.  States he felt normal yesterday except that he felt like he was starting to get a cold and he took Coricidin last night. Felt drowsy this morning & feels dizzy when he sits up, but does fine when he is lying down. Does not feel like he is going to pass out. Feels slightly off-balance.  No history of vertigo or seizures.  1 year old son is at the house with him and does not have any symptoms.  Productive cough yesterday. Slight sore throat. No sinus congestion or pressure. No HA, CP, SOB. No fever or chills. No N/V/D or abdominal pain. No weakness on one side. No slurred speech.   No known sick contacts. Drives truck for a living.  He has a history of high blood pressure and diabetes, both of which have been well controlled on his current medication regimen and on recent rechecks with his PCP.    Observations/Objective:  BP cuff is too small at the house, so he is unable to get an accurate blood pressure reading. He is also out of glucose strips.   He is talking to me clearly in full sentences without signs of distress.  He is alert and oriented x49.  His speech is not slurred.  Assessment and Plan: 1. Dizziness I stressed again the difficulty of evaluating dizziness over a phone call  visit.  However, I am not noting any red flag symptoms at this time.  I wonder if he does have a viral infection such as common cold starting up or possibly COVID-19.  I encouraged him to check a COVID-19 test at home and report back this afternoon with his result for me.  Because he has diabetes and history of hypertension, I do think it is very important for him to try to obtain an accurate blood pressure and sugar reading.  He is going to try to have his son pick up these tools from the pharmacy for him or he is going to contact EMS to come out and take a look at him just to check these vital signs.  He is also supposed to get back with me this afternoon about these readings.  He is understanding about red flag symptoms, which I have listed in the AVS, which would indicate an urgent need to go straight to the emergency department.  At this time I also wonder about possible vertigo with him and I encouraged him to try to rest, push fluids, take it easy the rest of the day and see if his symptoms subside.  He should seek an in person appointment should his symptoms persist.   Follow Up Instructions:    I discussed the assessment and treatment plan with the patient. The patient was provided an opportunity to ask questions and all were answered. The patient agreed with the plan and  demonstrated an understanding of the instructions.   The patient was advised to call back or seek an in-person evaluation if the symptoms worsen or if the condition fails to improve as anticipated.  I provided 49 minutes 42 seconds of non-face-to-face time during this encounter.   Maecie Sevcik M Laquida Cotrell, PA-C

## 2021-08-16 NOTE — Telephone Encounter (Signed)
Please find out what symptoms they are referring to.  There are no details regarding the symptoms that will prevent him from coming into the office.

## 2021-08-16 NOTE — Telephone Encounter (Signed)
Patient called requesting glucose and BP check. However, due to his symptoms he is unable to come into the practice. His wife will come in to pick up the glucose machine( one touch) and take his BP at home. The patient will need supplies called into the pharmacy for his glucose supplies. Cortez Flippen,cma

## 2021-08-16 NOTE — Patient Instructions (Addendum)
Things to do at home:  -COVID-19 test at home -Check BP  -Check sugar level  Send MyChart message by the end of the day with these results.  Continue to rest, push fluids, take it easy.  Go straight to the ED in the event of any sudden headaches, change in vision, weakness on one side, slurred speech, chest pain, or other sudden acute changes.

## 2021-08-17 ENCOUNTER — Other Ambulatory Visit: Payer: Self-pay

## 2021-08-17 DIAGNOSIS — E119 Type 2 diabetes mellitus without complications: Secondary | ICD-10-CM

## 2021-08-17 MED ORDER — GLUCOSE BLOOD VI STRP: ORAL_STRIP | 5 refills | Status: DC

## 2021-08-17 MED ORDER — ACCU-CHEK SOFTCLIX LANCETS MISC: 12 refills | Status: DC

## 2021-08-17 NOTE — Progress Notes (Signed)
Patients wife picked up a new meter yesterday and I sent supplies to the patients pharmacy.  Alzena Gerber,cma

## 2021-08-17 NOTE — Telephone Encounter (Signed)
I called and spoke with the patient to see how he was doing, patient stated his symptoms started on Thursday and he tested the same day, I informed him there is a possibility that he tested to early and I wanted him to retest on Tuesday of next week.  He stated he has a cough/ congestion and today he does feel a little better, no fever.  Patient has the glucose machine and if his supplies is not ordered I will order them and send it to his pharmacy.  Elloise Roark,cma

## 2021-09-17 ENCOUNTER — Encounter: Payer: Self-pay | Admitting: Family Medicine

## 2021-09-17 ENCOUNTER — Other Ambulatory Visit: Payer: Self-pay

## 2021-09-17 ENCOUNTER — Ambulatory Visit: Payer: BC Managed Care – PPO | Admitting: Family Medicine

## 2021-09-17 VITALS — BP 140/80 | HR 83 | Temp 98.1°F | Ht 74.0 in | Wt 327.8 lb

## 2021-09-17 DIAGNOSIS — I10 Essential (primary) hypertension: Secondary | ICD-10-CM | POA: Diagnosis not present

## 2021-09-17 DIAGNOSIS — F17290 Nicotine dependence, other tobacco product, uncomplicated: Secondary | ICD-10-CM | POA: Diagnosis not present

## 2021-09-17 DIAGNOSIS — E119 Type 2 diabetes mellitus without complications: Secondary | ICD-10-CM | POA: Diagnosis not present

## 2021-09-17 MED ORDER — METFORMIN HCL ER 500 MG PO TB24: 1000.0000 mg | ORAL_TABLET | Freq: Every day | ORAL | 2 refills | Status: DC

## 2021-09-17 MED ORDER — TELMISARTAN 80 MG PO TABS: 80.0000 mg | ORAL_TABLET | Freq: Every day | ORAL | 1 refills | Status: DC

## 2021-09-17 NOTE — Patient Instructions (Signed)
Nice to see you. We are going to have you take metformin 1000 mg once daily. We are going to switch your losartan to a medicine called telmisartan.  You can discontinue the losartan and start the telmisartan the next day.  This may help your blood pressure more so than the losartan.  He will have labs in 10 days with a nurse blood pressure check.

## 2021-09-17 NOTE — Assessment & Plan Note (Signed)
Congratulated on smoking cessation.  

## 2021-09-17 NOTE — Assessment & Plan Note (Signed)
Above goal.  We will try switching his losartan to telmisartan and having him continue on HCTZ 25 mg daily and diltiazem 240 mg daily.  He will have labs and a nurse blood pressure check in 10 days.  He will follow-up with me in 3 months.

## 2021-09-17 NOTE — Progress Notes (Signed)
Eric Rumps, MD Phone: [phone]  SERJIO Stevens is a 50 y.o. male who presents today for f/u.  DIABETES Disease Monitoring: Blood Sugar ranges-not checking Polyuria/phagia/dipsia- no      Optho- due Medications: Compliance- taking metformin Hypoglycemic symptoms- no  HYPERTENSION Disease Monitoring Home BP Monitoring not checking Chest pain- no    Dyspnea- no Medications Compliance-  taking HCTZ, losartan.   Edema- no BMET    Component Value Date/Time   NA 138 06/14/2021 0913   NA 142 11/20/2014 1840   K 3.9 06/14/2021 0913   K 3.9 11/20/2014 1840   CL 103 06/14/2021 0913   CL 107 11/20/2014 1840   CO2 25 06/14/2021 0913   CO2 31 11/20/2014 1840   GLUCOSE 129 (H) 06/14/2021 0913   GLUCOSE 82 11/20/2014 1840   BUN 17 06/14/2021 0913   BUN 14 11/20/2014 1840   CREATININE 1.11 06/14/2021 0913   CREATININE 1.18 10/20/2020 1543   CALCIUM 9.9 06/14/2021 0913   CALCIUM 9.4 11/20/2014 1840   GFRNONAA >60 09/22/2016 0959   GFRNONAA >60 11/20/2014 1840   GFRAA >60 09/22/2016 0959   GFRAA >60 11/20/2014 1840   Tobacco use: patient notes he quit about 1 week after starting the wellbutrin. He continued it for one month and then discontinued the wellbutrin. He has not been smoking and has been doing well without the wellbutrin.    Social History   Tobacco Use  Smoking Status Former   Years: 5.00   Types: Cigarettes  Smokeless Tobacco Never    Current Outpatient Medications on File Prior to Visit  Medication Sig Dispense Refill   Accu-Chek Softclix Lancets lancets Use as instructed 100 each 12   ANUCORT-HC 25 MG suppository INSERT 1 SUPPOSITORY RECTALLY ONCE DAILY     atorvastatin (LIPITOR) 40 MG tablet Take 1 tablet (40 mg total) by mouth daily. 90 tablet 1   buPROPion (WELLBUTRIN SR) 150 MG 12 hr tablet Take 1 tablet (150 mg total) by mouth 2 (two) times daily. 180 tablet 1   diltiazem (CARDIZEM CD) 240 MG 24 hr capsule Take 1 capsule (240 mg total) by mouth  daily. 90 capsule 1   glucose blood test strip Use as instructed with One Touch Verio to test blood sugars twice daily. Dx: E11.9 100 each 5   hydrochlorothiazide (HYDRODIURIL) 25 MG tablet Take 1 tablet (25 mg total) by mouth daily. 90 tablet 1   hydrocortisone (ANUSOL-HC) 2.5 % rectal cream Place 1 application rectally 2 (two) times daily. 30 g 0   No current facility-administered medications on file prior to visit.     ROS see history of present illness  Objective  Physical Exam Vitals:   09/17/21 1602 09/17/21 1625  BP: (!) 140/100 140/80  Pulse: 83   Temp: 98.1 F (36.7 C)   SpO2: 98%     BP Readings from Last 3 Encounters:  09/17/21 140/80  06/14/21 124/80  10/20/20 118/80   Wt Readings from Last 3 Encounters:  09/17/21 (!) 327 lb 12.8 oz (148.7 kg)  06/14/21 (!) 323 lb (146.5 kg)  10/20/20 (!) 314 lb 3.2 oz (142.5 kg)    Physical Exam Constitutional:      General: He is not in acute distress.    Appearance: He is not diaphoretic.  Cardiovascular:     Rate and Rhythm: Normal rate and regular rhythm.     Heart sounds: Normal heart sounds.  Pulmonary:     Effort: Pulmonary effort is normal.  Breath sounds: Normal breath sounds.  Skin:    General: Skin is warm and dry.  Neurological:     Mental Status: He is alert.   Diabetic Foot Exam - Simple   Simple Foot Form Diabetic Foot exam was performed with the following findings: Yes 09/17/2021  4:23 PM  Visual Inspection See comments: Yes Sensation Testing Intact to touch and monofilament testing bilaterally: Yes Pulse Check Posterior Tibialis and Dorsalis pulse intact bilaterally: Yes Comments Onychomycosis all of his toenails, some skin flaking, no ulcerations or skin breakdown      Assessment/Plan: Please see individual problem list.  Problem List Items Addressed This Visit     Diabetes mellitus, type II ([place])    Slightly above goal with an A1c of 7.1.  We will have him take metformin XR 1000  mg once daily.  He was encouraged to see his eye doctor for a yearly visit.      Relevant Medications   telmisartan (MICARDIS) 80 MG tablet   metFORMIN (GLUCOPHAGE XR) 500 MG 24 hr tablet   Hypertension - Primary    Above goal.  We will try switching his losartan to telmisartan and having him continue on HCTZ 25 mg daily and diltiazem 240 mg daily.  He will have labs and a nurse blood pressure check in 10 days.  He will follow-up with me in 3 months.      Relevant Medications   telmisartan (MICARDIS) 80 MG tablet   Other Relevant Orders   Basic Metabolic Panel (BMET)   Nicotine dependence, other tobacco product, uncomplicated    Congratulated on smoking cessation.        Return in about 10 days (around 09/27/2021) for Nurse blood pressure check, labs, 3 months PCP.  This visit occurred during the SARS-CoV-2 public health emergency.  Safety protocols were in place, including screening questions prior to the visit, additional usage of staff PPE, and extensive cleaning of exam room while observing appropriate contact time as indicated for disinfecting solutions.    Eric Rumps, MD [place]

## 2021-09-17 NOTE — Assessment & Plan Note (Addendum)
Slightly above goal with an A1c of 7.1.  We will have him take metformin XR 1000 mg once daily.  He was encouraged to see his eye doctor for a yearly visit.

## 2021-09-27 ENCOUNTER — Ambulatory Visit (INDEPENDENT_AMBULATORY_CARE_PROVIDER_SITE_OTHER): Payer: BC Managed Care – PPO

## 2021-09-27 ENCOUNTER — Other Ambulatory Visit: Payer: BC Managed Care – PPO

## 2021-09-27 ENCOUNTER — Other Ambulatory Visit: Payer: Self-pay

## 2021-09-27 VITALS — BP 130/90 | HR 94 | Ht 74.02 in | Wt 326.0 lb

## 2021-09-27 DIAGNOSIS — Z0130 Encounter for examination of blood pressure without abnormal findings: Secondary | ICD-10-CM | POA: Diagnosis not present

## 2021-09-27 NOTE — Progress Notes (Addendum)
Patient here for nurse visit BP check per order from [facility]. Please see order from Office visit on 09/17/21  Patient reports compliance with prescribed BP medications: yes, Patient was instructed to stop the Losartan and switch to Telmisartan.  BP Medication the patient is taking: Telmisartan, Diltiazem, Hydrochlorothiazide   BP medication the patient is not taking:Pt does not remember the name of the medication they were supposed to stop. Pt was instructed to stop taking the Losartan  Last dose of BP medication: 09/27/2021 at 6:30am  BP Readings from Last 3 Encounters:  09/27/21 130/90  09/17/21 140/80  06/14/21 124/80   Pulse Readings from Last 3 Encounters:  09/27/21 94  09/17/21 83  06/14/21 86    Any Headaches? Dizziness? Light Headedness? Pt states he is having a headache as he has been awake since 2:30am. Pt reports no other symptoms. Pts first blood pressure was 158/90. Pt was instructed to wait 10 minutes for a BP recheck. After 10 minutes, pt blood pressure came down to 130/90.   Zena, CMA

## 2021-10-01 ENCOUNTER — Other Ambulatory Visit: Payer: Self-pay | Admitting: Internal Medicine

## 2021-10-01 ENCOUNTER — Telehealth: Payer: Self-pay | Admitting: Internal Medicine

## 2021-10-01 NOTE — Telephone Encounter (Signed)
Note. Can you ask him if he remembers the specific reason he was placed on cardizem for? Was it for his BP or for a heart rate issue? If it was just for his BP I would suggest changing it to amlodipine as that would have a better effect on his BP.

## 2021-10-01 NOTE — Telephone Encounter (Signed)
BP still slightly elevated is he agreeable increase cardizem to 300mg  ?  Make sure has f/u with PCP? Stop losartan on telmisartan 80 mg, hctz 25 mg qd and diltiazem 240 for now want to increase to 300   Patient here for nurse visit BP check per order from [place]. Please see order from Office visit on 09/17/21   Patient reports compliance with prescribed BP medications: yes, Patient was instructed to stop the Losartan and switch to Telmisartan.   BP Medication the patient is taking: Telmisartan, Diltiazem, Hydrochlorothiazide    BP medication the patient is not taking:Pt does not remember the name of the medication they were supposed to stop. Pt was instructed to stop taking the Losartan   Last dose of BP medication: 09/27/2021 at 6:30am      BP Readings from Last 3 Encounters:  09/27/21 130/90  09/17/21 140/80  06/14/21 124/80       Pulse Readings from Last 3 Encounters:  09/27/21 94  09/17/21 83  06/14/21 86      Any Headaches? Dizziness? Light Headedness? Pt states he is having a headache as he has been awake since 2:30am. Pt reports no other symptoms. Pts first blood pressure was 158/90. Pt was instructed to wait 10 minutes for a BP recheck. After 10 minutes, pt blood pressure came down to 130/90.    [place], CMA

## 2021-10-01 NOTE — Telephone Encounter (Signed)
Patient states that he was put on the Cardizem for his Blood pressure. I informed him that the provider wants to switch him to Amlodipine. The patient stated that is fine .

## 2021-10-01 NOTE — Telephone Encounter (Signed)
LVM for patient o call back. Eric Stevens,cma

## 2021-10-01 NOTE — Telephone Encounter (Signed)
I called and spoke with the patient and he stated the Cardizem was giving because of the BP.  He is okay with the change to amlodipine, I informed him I would call him back to let him know when it was sent.  Emojean Gertz,cma

## 2021-10-02 MED ORDER — AMLODIPINE BESYLATE 10 MG PO TABS
10.0000 mg | ORAL_TABLET | Freq: Every day | ORAL | 1 refills | Status: DC
Start: 2021-10-02 — End: 2022-03-06

## 2021-10-02 NOTE — Telephone Encounter (Signed)
I sent the amlodipine in for him to start on. He can discontinue the cardizem and start the amlodipine the next day. He needs a nurse BP check in one month.

## 2021-10-02 NOTE — Telephone Encounter (Signed)
I called d and spoke with the patient and he is scheduled for a BP check.  Sonora Catlin,cma

## 2021-10-02 NOTE — Addendum Note (Signed)
Addended by: [name], Sabirin Baray G on: 10/02/2021 12:55 PM   Modules accepted: Orders

## 2021-11-01 ENCOUNTER — Ambulatory Visit (INDEPENDENT_AMBULATORY_CARE_PROVIDER_SITE_OTHER): Payer: BC Managed Care – PPO | Admitting: *Deleted

## 2021-11-01 ENCOUNTER — Other Ambulatory Visit: Payer: Self-pay

## 2021-11-01 VITALS — BP 128/80 | HR 84 | Resp 14

## 2021-11-01 DIAGNOSIS — I10 Essential (primary) hypertension: Secondary | ICD-10-CM

## 2021-11-01 NOTE — Progress Notes (Signed)
Patient here for nurse visit BP check per order from 10/01/21.   Patient reports compliance with prescribed BP medications: yes, Amlodipine 10 mg, HCTZ 25 mg, Telmisartan 80mg    Last dose of BP medication: 10 AM today  BP Readings from Last 3 Encounters:  11/01/21 128/80  09/27/21 130/90  09/17/21 140/80   Pulse Readings from Last 3 Encounters:  11/01/21 84  09/27/21 94  09/17/21 83    Patient denies any symptoms.  Patient verbalized understanding of instructions.   Kerin Salen, RN

## 2021-11-02 ENCOUNTER — Ambulatory Visit: Payer: BC Managed Care – PPO

## 2021-11-06 ENCOUNTER — Ambulatory Visit: Payer: BC Managed Care – PPO

## 2021-11-06 NOTE — Progress Notes (Signed)
Blood pressure is acceptable.  He should continue with his current medications.  [name], MD

## 2021-11-13 NOTE — Progress Notes (Signed)
I called and spoke with the patient and informed im that the provider wants him to continue his BP medications his BP log showed it was acceptable.  Hasini Peachey,cma

## 2021-11-19 ENCOUNTER — Other Ambulatory Visit: Payer: Self-pay

## 2021-11-19 ENCOUNTER — Emergency Department
Admission: EM | Admit: 2021-11-19 | Discharge: 2021-11-19 | Disposition: A | Discharge status: Home / Self Care | Payer: BC Managed Care – PPO | Attending: Emergency Medicine | Admitting: Emergency Medicine

## 2021-11-19 DIAGNOSIS — I10 Essential (primary) hypertension: Secondary | ICD-10-CM | POA: Diagnosis not present

## 2021-11-19 DIAGNOSIS — R1033 Periumbilical pain: Secondary | ICD-10-CM | POA: Diagnosis not present

## 2021-11-19 DIAGNOSIS — Z7984 Long term (current) use of oral hypoglycemic drugs: Secondary | ICD-10-CM | POA: Diagnosis not present

## 2021-11-19 DIAGNOSIS — Z79899 Other long term (current) drug therapy: Secondary | ICD-10-CM | POA: Insufficient documentation

## 2021-11-19 DIAGNOSIS — E119 Type 2 diabetes mellitus without complications: Secondary | ICD-10-CM | POA: Diagnosis not present

## 2021-11-19 HISTORY — DX: Type 2 diabetes mellitus without complications: E11.9

## 2021-11-19 LAB — COMPREHENSIVE METABOLIC PANEL
ALT: 59 U/L — ABNORMAL HIGH (ref 0–44)
AST: 37 U/L (ref 15–41)
Albumin: 4 g/dL (ref 3.5–5.0)
Alkaline Phosphatase: 60 U/L (ref 38–126)
Anion gap: 7 (ref 5–15)
BUN: 16 mg/dL (ref 6–20)
CO2: 25 mmol/L (ref 22–32)
Calcium: 9.1 mg/dL (ref 8.9–10.3)
Chloride: 105 mmol/L (ref 98–111)
Creatinine, Ser: 1.13 mg/dL (ref 0.61–1.24)
GFR, Estimated: >60 mL/min (ref >60)
Glucose, Bld: 118 mg/dL — ABNORMAL HIGH (ref 70–99)
Potassium: 4 mmol/L (ref 3.5–5.1)
Sodium: 137 mmol/L (ref 135–145)
Total Bilirubin: 0.9 mg/dL (ref 0.3–1.2)
Total Protein: 7.6 g/dL (ref 6.5–8.1)

## 2021-11-19 LAB — CBC
HCT: 47.2 % (ref 39.0–52.0)
Hemoglobin: 15.8 g/dL (ref 13.0–17.0)
MCH: 30.8 pg (ref 26.0–34.0)
MCHC: 33.5 g/dL (ref 30.0–36.0)
MCV: 92 fL (ref 80.0–100.0)
Platelets: 282 10*3/uL (ref 150–400)
RBC: 5.13 MIL/uL (ref 4.22–5.81)
RDW: 12.6 % (ref 11.5–15.5)
WBC: 6.9 10*3/uL (ref 4.0–10.5)
nRBC: 0 % (ref 0.0–0.2)

## 2021-11-19 LAB — URINALYSIS, ROUTINE W REFLEX MICROSCOPIC
Bacteria, UA: NONE SEEN
Bilirubin Urine: NEGATIVE
Glucose, UA: NEGATIVE mg/dL
Hgb urine dipstick: NEGATIVE
Ketones, ur: NEGATIVE mg/dL
Leukocytes,Ua: NEGATIVE
Nitrite: NEGATIVE
Protein, ur: 30 mg/dL — AB
Specific Gravity, Urine: 1.026 (ref 1.005–1.030)
pH: 5 (ref 5.0–8.0)

## 2021-11-19 LAB — LIPASE, BLOOD: Lipase: 30 U/L (ref 11–51)

## 2021-11-19 MED ORDER — DICYCLOMINE HCL 10 MG PO CAPS: 10.0000 mg | ORAL_CAPSULE | Freq: Three times a day (TID) | ORAL | 0 refills | Status: DC | PRN

## 2021-11-19 MED ORDER — DICYCLOMINE HCL 10 MG PO CAPS
20.0000 mg | ORAL_CAPSULE | Freq: Once | ORAL | Status: AC
  Administered 2021-11-19: 20 mg via ORAL
  Filled 2021-11-19: qty 2

## 2021-11-19 NOTE — ED Triage Notes (Signed)
Pt to ED for intermittent mid lower abd pain for past few weeks. +chronic diarrhea.  Denies urinary sx  MSE brandon PA in triage

## 2021-11-19 NOTE — Discharge Instructions (Signed)
Please follow up with GI. Please return for any fevers, chills, vomiting, persistent bloody stool or diarrhea, worsening abdominal pain or any other new or concerning symptoms.

## 2021-11-19 NOTE — ED Provider Notes (Signed)
°  Emergency Medicine Provider Triage Evaluation Note  Eric Stevens , a 51 y.o.male,  was evaluated in triage.  Pt complains of abdominal pain has been going on for the past few weeks.  Pain is on and off.  Dependent on positioning.  Localized mostly in the epigastric to umbilicus region.  Denies fever/chills, chest pain, shortness of breath, back pain, or urinary symptoms.  Denies nausea/vomiting or diarrhea.   Review of Systems  Positive: Abdomen Negative: Denies fever, chest pain, vomiting  Physical Exam   Vitals:   11/19/21 1419  BP: (!) 147/112  Pulse: (!) 105  Resp: 18  Temp: 98.8 F (37.1 C)  SpO2: 96%   Gen:   Awake, no distress   Resp:  Normal effort  MSK:   Moves extremities without difficulty  Other:  No abdominal tenderness noted  Medical Decision Making  Given the patient's initial medical screening exam, the following diagnostic evaluation has been ordered. The patient will be placed in the appropriate treatment space, once one is available, to complete the evaluation and treatment. I have discussed the plan of care with the patient and I have advised the patient that an ED physician or mid-level practitioner will reevaluate their condition after the test results have been received, as the results may give them additional insight into the type of treatment they may need.    Diagnostics: Labs, UA  Treatments: none immediately   Teodoro Spray, [place] 11/19/21 1425    Duffy Bruce, MD 11/19/21 1719

## 2021-11-19 NOTE — ED Provider Notes (Signed)
[facility] Provider Note    Event Date/Time   First MD Initiated Contact with Patient 11/19/21 1552     (approximate)   History   Abdominal Pain   HPI  Eric Stevens is a 51 y.o. male who has, per PCP visit dated 10.31.22, diabetes and HTN, on metformin, telmisartan, HCTZ and diltiazem.  Presents to the emergency department today because of concerns for abdominal pain.  The pain is located periumbilically.  It has been present over the past 3 weeks.  Described as a nagging type of pain.  It does sometimes move around the stomach.  It is intermittent.  It is somewhat relieved with bowel movements.  He has not noticed any change to the pain with different ingestions.  Denies any recent unusual ingestions or travel.  States he has history of gastric ulcers in the past.  Denies any vomiting.  Denies any change in his appetite.    Physical Exam   Triage Vital Signs: ED Triage Vitals  Enc Vitals Group     BP 11/19/21 1419 (!) 147/112     Pulse Rate 11/19/21 1419 (!) 105     Resp 11/19/21 1419 18     Temp 11/19/21 1419 98.8 F (37.1 C)     Temp Source 11/19/21 1419 Oral     SpO2 11/19/21 1419 96 %     Weight 11/19/21 1420 300 lb (136.1 kg)     Height 11/19/21 1420 6\' 2"  (1.88 m)     Head Circumference --      Peak Flow --      Pain Score 11/19/21 1420 5   Most recent vital signs: Vitals:   11/19/21 1419 11/19/21 1554  BP: (!) 147/112 (!) 159/111  Pulse: (!) 105 (!) 109  Resp: 18 18  Temp: 98.8 F (37.1 C)   SpO2: 96% 97%   General: Awake, no distress. CV:  Good peripheral perfusion.  Resp:  Normal effort.  Abd:  No distention. None tender.    ED Results / Procedures / Treatments   Labs (all labs ordered are listed, but only abnormal results are displayed) Labs Reviewed  COMPREHENSIVE METABOLIC PANEL - Abnormal; Notable for the following components:      Result Value   Glucose, Bld 118 (*)    ALT 59 (*)    All other components within  normal limits  URINALYSIS, ROUTINE W REFLEX MICROSCOPIC - Abnormal; Notable for the following components:   Color, Urine YELLOW (*)    APPearance CLEAR (*)    Protein, ur 30 (*)    All other components within normal limits  LIPASE, BLOOD  CBC     EKG  None   RADIOLOGY None   PROCEDURES:  Critical Care performed: No  Procedures   MEDICATIONS ORDERED IN ED: Medications - No data to display   IMPRESSION / MDM / [place] / ED COURSE  I reviewed the triage vital signs and the nursing notes.  Differential diagnosis includes, but is not limited to, gallbladder disease, appendicitis, diverticulitis, colitis, pancreatitis, hepatitis, Crohn.   The patient blood work without elevated lipase, hepatic function panel essentially within normal limits.  CBC without any concerning leukocytosis.  Urine without red blood cells or white blood cells.  At this time I have low suspicion for significant intra-abdominal infection.  Patient did feel better after Bentyl.  Given patient's improvement with Bentyl at this time and reassuring blood work I do not feel patient requires any  emergent imaging or admission.  I did discuss with patient follow-up with GI.  Will give patient prescription for Bentyl.     FINAL CLINICAL IMPRESSION(S) / ED DIAGNOSES   Final diagnoses:  Periumbilical abdominal pain     Rx / DC Orders   ED Discharge Orders          Ordered    dicyclomine (BENTYL) 10 MG capsule  3 times daily PRN        11/19/21 1748             Note:  This document was prepared using Dragon voice recognition software and may include unintentional dictation errors.    Nance Pear, MD 11/19/21 [phone]

## 2021-12-31 ENCOUNTER — Ambulatory Visit: Payer: BC Managed Care – PPO | Admitting: Family Medicine

## 2022-01-29 ENCOUNTER — Other Ambulatory Visit: Payer: Self-pay | Admitting: Family Medicine

## 2022-01-29 DIAGNOSIS — E785 Hyperlipidemia, unspecified: Secondary | ICD-10-CM

## 2022-02-03 ENCOUNTER — Encounter: Payer: Self-pay | Admitting: Family Medicine

## 2022-02-03 DIAGNOSIS — E119 Type 2 diabetes mellitus without complications: Secondary | ICD-10-CM

## 2022-02-04 MED ORDER — METFORMIN HCL ER 500 MG PO TB24: 1000.0000 mg | ORAL_TABLET | Freq: Every day | ORAL | 2 refills | Status: DC

## 2022-02-04 MED ORDER — SCOPOLAMINE 1 MG/3DAYS TD PT72: 1.0000 | MEDICATED_PATCH | TRANSDERMAL | 0 refills | Status: DC

## 2022-03-06 ENCOUNTER — Other Ambulatory Visit: Payer: Self-pay

## 2022-03-06 ENCOUNTER — Telehealth: Payer: Self-pay | Admitting: Family Medicine

## 2022-03-06 DIAGNOSIS — E119 Type 2 diabetes mellitus without complications: Secondary | ICD-10-CM

## 2022-03-06 DIAGNOSIS — F17290 Nicotine dependence, other tobacco product, uncomplicated: Secondary | ICD-10-CM

## 2022-03-06 DIAGNOSIS — I10 Essential (primary) hypertension: Secondary | ICD-10-CM

## 2022-03-06 MED ORDER — TELMISARTAN 80 MG PO TABS: 80.0000 mg | ORAL_TABLET | Freq: Every day | ORAL | 1 refills | Status: DC

## 2022-03-06 MED ORDER — GLUCOSE BLOOD VI STRP: ORAL_STRIP | 5 refills | Status: DC

## 2022-03-06 MED ORDER — HYDROCHLOROTHIAZIDE 25 MG PO TABS: 25.0000 mg | ORAL_TABLET | Freq: Every day | ORAL | 1 refills | Status: DC

## 2022-03-06 MED ORDER — ACCU-CHEK SOFTCLIX LANCETS MISC: 12 refills | Status: AC

## 2022-03-06 MED ORDER — AMLODIPINE BESYLATE 10 MG PO TABS: 10.0000 mg | ORAL_TABLET | Freq: Every day | ORAL | 1 refills | Status: DC

## 2022-03-06 NOTE — Telephone Encounter (Signed)
Pt wife called in requesting refill on medications (Accu-Chek Softclix Lancets lancets), ?(amLODipine (NORVASC) 10 MG tablet), ?(ANUCORT-HC 25 MG suppository), ?(atorvastatin (LIPITOR) 40 MG tablet), ?(buPROPion (WELLBUTRIN SR) 150 MG 12 hr tablet), ?(dicyclomine (BENTYL) 10 MG capsule), ?(glucose blood test strip), ?(hydrochlorothiazide (HYDRODIURIL) 25 MG tablet), ?(hydrocortisone (ANUSOL-HC) 2.5 % rectal cream), ?(metFORMIN (GLUCOPHAGE XR) 500 MG 24 hr tablet), ?(scopolamine (TRANSDERM-SCOP) 1 MG/3DAYS), and  ?(telmisartan (MICARDIS) 80 MG tablet)... Pt wife requesting medications to be refilled today.. Pt wife stated that the will be out of town... Advised Pt wife that the request for refill on medication for today is not guarantee to be refill today... Pt wife requested to speak with Gae Bon... Advised nina is wife pts right now... Pt wife hung up phone...   ? ?

## 2022-03-06 NOTE — Telephone Encounter (Signed)
I refilled the medications that the patient requested not all but  some and I called the patient and they did not need them all but they needed the ones I sent.  I also informed the patients wife that he needed to call and schedule a visit.  He understood.  Eric Stevens,cma  ?

## 2022-04-08 ENCOUNTER — Other Ambulatory Visit: Payer: Self-pay

## 2022-04-08 ENCOUNTER — Emergency Department
Admission: EM | Admit: 2022-04-08 | Discharge: 2022-04-08 | Disposition: A | Discharge status: Home / Self Care | Payer: BC Managed Care – PPO | Attending: Emergency Medicine | Admitting: Emergency Medicine

## 2022-04-08 DIAGNOSIS — E119 Type 2 diabetes mellitus without complications: Secondary | ICD-10-CM | POA: Insufficient documentation

## 2022-04-08 DIAGNOSIS — I10 Essential (primary) hypertension: Secondary | ICD-10-CM | POA: Diagnosis not present

## 2022-04-08 DIAGNOSIS — N3090 Cystitis, unspecified without hematuria: Secondary | ICD-10-CM | POA: Insufficient documentation

## 2022-04-08 DIAGNOSIS — D72829 Elevated white blood cell count, unspecified: Secondary | ICD-10-CM | POA: Diagnosis not present

## 2022-04-08 DIAGNOSIS — R109 Unspecified abdominal pain: Secondary | ICD-10-CM | POA: Diagnosis not present

## 2022-04-08 LAB — CBC
HCT: 47.3 % (ref 39.0–52.0)
Hemoglobin: 15.6 g/dL (ref 13.0–17.0)
MCH: 29.8 pg (ref 26.0–34.0)
MCHC: 33 g/dL (ref 30.0–36.0)
MCV: 90.4 fL (ref 80.0–100.0)
Platelets: 288 10*3/uL (ref 150–400)
RBC: 5.23 MIL/uL (ref 4.22–5.81)
RDW: 12.7 % (ref 11.5–15.5)
WBC: 12.1 10*3/uL — ABNORMAL HIGH (ref 4.0–10.5)
nRBC: 0 % (ref 0.0–0.2)

## 2022-04-08 LAB — URINALYSIS, ROUTINE W REFLEX MICROSCOPIC
Bilirubin Urine: NEGATIVE
Glucose, UA: NEGATIVE mg/dL
Hgb urine dipstick: NEGATIVE
Ketones, ur: NEGATIVE mg/dL
Leukocytes,Ua: NEGATIVE
Nitrite: POSITIVE — AB
Protein, ur: 30 mg/dL — AB
Specific Gravity, Urine: 1.02 (ref 1.005–1.030)
pH: 7 (ref 5.0–8.0)

## 2022-04-08 LAB — COMPREHENSIVE METABOLIC PANEL
ALT: 45 U/L — ABNORMAL HIGH (ref 0–44)
AST: 23 U/L (ref 15–41)
Albumin: 3.9 g/dL (ref 3.5–5.0)
Alkaline Phosphatase: 72 U/L (ref 38–126)
Anion gap: 8 (ref 5–15)
BUN: 12 mg/dL (ref 6–20)
CO2: 25 mmol/L (ref 22–32)
Calcium: 9.2 mg/dL (ref 8.9–10.3)
Chloride: 105 mmol/L (ref 98–111)
Creatinine, Ser: 1.07 mg/dL (ref 0.61–1.24)
GFR, Estimated: >60 mL/min (ref >60)
Glucose, Bld: 159 mg/dL — ABNORMAL HIGH (ref 70–99)
Potassium: 3.9 mmol/L (ref 3.5–5.1)
Sodium: 138 mmol/L (ref 135–145)
Total Bilirubin: 0.8 mg/dL (ref 0.3–1.2)
Total Protein: 7.9 g/dL (ref 6.5–8.1)

## 2022-04-08 LAB — LIPASE, BLOOD: Lipase: 27 U/L (ref 11–51)

## 2022-04-08 MED ORDER — SULFAMETHOXAZOLE-TRIMETHOPRIM 800-160 MG PO TABS: 1.0000 | ORAL_TABLET | Freq: Two times a day (BID) | ORAL | 0 refills | Status: AC

## 2022-04-08 NOTE — ED Notes (Signed)
See triage note  presents with lower abd cramping for couple of days  denies any n/v/d or fever  states he is having some pressure when voiding

## 2022-04-08 NOTE — ED Triage Notes (Signed)
Pt comes with c/o lower abdominal pain. Pt states this all started Saturday. Pt states pressure when urinating.

## 2022-04-08 NOTE — ED Provider Notes (Signed)
[facility] Provider Note    Event Date/Time   First MD Initiated Contact with Patient 04/08/22 [phone]     (approximate)   History   Chief Complaint Abdominal Pain   HPI  Eric Stevens is a 51 y.o. male with past medical history of hypertension, hyperlipidemia, and diabetes who presents to the ED complaining of abdominal pain.  Patient reports that for the past 2 days he has been dealing with intermittent pain in his suprapubic and groin area.  He describes it as a pressure that is worse when he goes to urinate, but he denies any dysuria or hematuria.  He has not had any fevers, chills, nausea, vomiting, flank pain, or changes in his bowel movements.  He denies any pain in his rectal area or back, reports history of prostatitis but states current symptoms do not feel similar.     Physical Exam   Triage Vital Signs: ED Triage Vitals  Enc Vitals Group     BP 04/08/22 0800 (!) 137/101     Pulse Rate 04/08/22 0800 89     Resp 04/08/22 0800 19     Temp 04/08/22 0800 98.4 F (36.9 C)     Temp Source 04/08/22 0800 Oral     SpO2 04/08/22 0800 96 %     Weight --      Height --      Head Circumference --      Peak Flow --      Pain Score 04/08/22 0755 7     Pain Loc --      Pain Edu? --      Excl. in [place]? --     Most recent vital signs: Vitals:   04/08/22 0800  BP: (!) 137/101  Pulse: 89  Resp: 19  Temp: 98.4 F (36.9 C)  SpO2: 96%    Constitutional: Alert and oriented. Eyes: Conjunctivae are normal. Head: Atraumatic. Nose: No congestion/rhinnorhea. Mouth/Throat: Mucous membranes are moist.  Cardiovascular: Normal rate, regular rhythm. Grossly normal heart sounds.  2+ radial pulses bilaterally. Respiratory: Normal respiratory effort.  No retractions. Lungs CTAB. Gastrointestinal: Soft and mildly tender to palpation in the suprapubic area with no rebound or guarding.  No CVA tenderness bilaterally.  No distention. Genitourinary: No scrotal  edema or tenderness noted. Musculoskeletal: No lower extremity tenderness nor edema.  Neurologic:  Normal speech and language. No gross focal neurologic deficits are appreciated.    ED Results / Procedures / Treatments   Labs (all labs ordered are listed, but only abnormal results are displayed) Labs Reviewed  COMPREHENSIVE METABOLIC PANEL - Abnormal; Notable for the following components:      Result Value   Glucose, Bld 159 (*)    ALT 45 (*)    All other components within normal limits  CBC - Abnormal; Notable for the following components:   WBC 12.1 (*)    All other components within normal limits  URINALYSIS, ROUTINE W REFLEX MICROSCOPIC - Abnormal; Notable for the following components:   Color, Urine AMBER (*)    APPearance CLEAR (*)    Protein, ur 30 (*)    Nitrite POSITIVE (*)    Bacteria, UA RARE (*)    All other components within normal limits  URINE CULTURE  LIPASE, BLOOD    PROCEDURES:  Critical Care performed: No  Procedures   MEDICATIONS ORDERED IN ED: Medications - No data to display   IMPRESSION / MDM / [place] / ED COURSE  I reviewed the triage vital signs and the nursing notes.                              51 y.o. male with past medical history of hypertension, hyperlipidemia, diabetes who presents to the ED complaining of intermittent pain in his suprapubic and groin area for the past 2 days that is worse when he goes to urinate.  Differential diagnosis includes, but is not limited to, cystitis, kidney stone, pyelonephritis, prostatitis, diverticulitis, testicular torsion, scrotal cellulitis.  Patient well-appearing and in no acute distress, vital signs are unremarkable and he has a benign abdominal exam with only mild tenderness in his suprapubic area.  Symptoms seem consistent with acute cystitis and urinalysis is concerning for infection with positive nitrites and bacteria present.  We will send urine for culture, prostatitis  considered but seems less likely given no perineal or back pain, denies difficulty urinating.  Additional labs are unremarkable with CBC showing only mild leukocytosis, BMP without electrolyte abnormality or AKI, LFTs and lipase are unremarkable.  I do not feel CT imaging of his abdomen is indicated at this time given reassuring exam.  He is appropriate for discharge home with prescription for Bactrim for UTI, was counseled to follow-up with urology if symptoms do not improve to consider extension of antibiotics for prostatitis.  Patient counseled to return to the ED for new worsening symptoms, patient agrees with plan.      FINAL CLINICAL IMPRESSION(S) / ED DIAGNOSES   Final diagnoses:  Cystitis     Rx / DC Orders   ED Discharge Orders          Ordered    sulfamethoxazole-trimethoprim (BACTRIM DS) 800-160 MG tablet  2 times daily        04/08/22 0908             Note:  This document was prepared using Dragon voice recognition software and may include unintentional dictation errors.   Blake Divine, MD 04/08/22 [phone]

## 2022-04-09 LAB — URINE CULTURE: Culture: NO GROWTH

## 2022-04-10 DIAGNOSIS — R102 Pelvic and perineal pain: Secondary | ICD-10-CM | POA: Diagnosis not present

## 2022-04-10 DIAGNOSIS — Z125 Encounter for screening for malignant neoplasm of prostate: Secondary | ICD-10-CM | POA: Diagnosis not present

## 2022-04-10 DIAGNOSIS — N401 Enlarged prostate with lower urinary tract symptoms: Secondary | ICD-10-CM | POA: Diagnosis not present

## 2022-04-10 DIAGNOSIS — N23 Unspecified renal colic: Secondary | ICD-10-CM | POA: Diagnosis not present

## 2022-04-10 LAB — BASIC METABOLIC PANEL: Creatinine: 1.4 — AB (ref <=1.3)

## 2022-04-10 LAB — PSA: PSA: 0.4

## 2022-04-11 ENCOUNTER — Other Ambulatory Visit: Payer: Self-pay | Admitting: Urology

## 2022-04-11 DIAGNOSIS — R102 Pelvic and perineal pain: Secondary | ICD-10-CM

## 2022-04-11 DIAGNOSIS — N23 Unspecified renal colic: Secondary | ICD-10-CM

## 2022-04-11 DIAGNOSIS — N138 Other obstructive and reflux uropathy: Secondary | ICD-10-CM

## 2022-04-17 ENCOUNTER — Ambulatory Visit: Payer: BC Managed Care – PPO

## 2022-04-18 ENCOUNTER — Ambulatory Visit
Admission: RE | Admit: 2022-04-18 | Discharge: 2022-04-18 | Disposition: A | Discharge status: Home / Self Care | Payer: BC Managed Care – PPO | Source: Ambulatory Visit | Attending: Urology | Admitting: Urology

## 2022-04-18 DIAGNOSIS — K5730 Diverticulosis of large intestine without perforation or abscess without bleeding: Secondary | ICD-10-CM | POA: Diagnosis not present

## 2022-04-18 DIAGNOSIS — N401 Enlarged prostate with lower urinary tract symptoms: Secondary | ICD-10-CM | POA: Diagnosis not present

## 2022-04-18 DIAGNOSIS — N138 Other obstructive and reflux uropathy: Secondary | ICD-10-CM | POA: Diagnosis not present

## 2022-04-18 DIAGNOSIS — N23 Unspecified renal colic: Secondary | ICD-10-CM | POA: Diagnosis not present

## 2022-04-18 DIAGNOSIS — K760 Fatty (change of) liver, not elsewhere classified: Secondary | ICD-10-CM | POA: Diagnosis not present

## 2022-04-18 DIAGNOSIS — R102 Pelvic and perineal pain: Secondary | ICD-10-CM | POA: Insufficient documentation

## 2022-04-18 DIAGNOSIS — K828 Other specified diseases of gallbladder: Secondary | ICD-10-CM | POA: Diagnosis not present

## 2022-04-18 MED ORDER — IOHEXOL 300 MG/ML  SOLN
100.0000 mL | Freq: Once | INTRAMUSCULAR | Status: AC | PRN
  Administered 2022-04-18: 100 mL via INTRAVENOUS

## 2022-04-22 DIAGNOSIS — R102 Pelvic and perineal pain: Secondary | ICD-10-CM | POA: Diagnosis not present

## 2022-04-22 DIAGNOSIS — R1031 Right lower quadrant pain: Secondary | ICD-10-CM | POA: Diagnosis not present

## 2022-04-26 ENCOUNTER — Encounter: Payer: Self-pay | Admitting: Family Medicine

## 2022-05-01 ENCOUNTER — Encounter: Payer: Self-pay | Admitting: Family Medicine

## 2022-05-01 ENCOUNTER — Ambulatory Visit: Payer: BC Managed Care – PPO | Admitting: Family Medicine

## 2022-05-01 VITALS — BP 128/82 | HR 102 | Temp 97.9°F | Ht 74.0 in | Wt 317.8 lb

## 2022-05-01 DIAGNOSIS — E119 Type 2 diabetes mellitus without complications: Secondary | ICD-10-CM

## 2022-05-01 DIAGNOSIS — Z8042 Family history of malignant neoplasm of prostate: Secondary | ICD-10-CM

## 2022-05-01 DIAGNOSIS — R935 Abnormal findings on diagnostic imaging of other abdominal regions, including retroperitoneum: Secondary | ICD-10-CM | POA: Diagnosis not present

## 2022-05-01 DIAGNOSIS — R8290 Unspecified abnormal findings in urine: Secondary | ICD-10-CM

## 2022-05-01 DIAGNOSIS — R3915 Urgency of urination: Secondary | ICD-10-CM | POA: Diagnosis not present

## 2022-05-01 DIAGNOSIS — M545 Low back pain, unspecified: Secondary | ICD-10-CM

## 2022-05-01 LAB — POCT URINALYSIS DIPSTICK
Bilirubin, UA: NEGATIVE
Blood, UA: NEGATIVE
Glucose, UA: NEGATIVE
Ketones, UA: 5
Leukocytes, UA: NEGATIVE
Nitrite, UA: NEGATIVE
Protein, UA: POSITIVE — AB
Spec Grav, UA: >=1.03 — AB (ref 1.010–1.025)
Urobilinogen, UA: 1 E.U./dL
pH, UA: 6 (ref 5.0–8.0)

## 2022-05-01 LAB — POCT GLYCOSYLATED HEMOGLOBIN (HGB A1C): Hemoglobin A1C: 7.4 % — AB (ref 4.0–5.6)

## 2022-05-01 MED ORDER — SULFAMETHOXAZOLE-TRIMETHOPRIM 800-160 MG PO TABS: 1.0000 | ORAL_TABLET | Freq: Two times a day (BID) | ORAL | 0 refills | Status: DC

## 2022-05-01 NOTE — Assessment & Plan Note (Signed)
Above goal.  He will continue metformin XR 1000 mg daily.  I did discuss the option of adding additional medication versus working on dietary changes.  He opted for dietary changes.  He was given dietary guidelines.  He will follow-up in 3 months.

## 2022-05-01 NOTE — Assessment & Plan Note (Signed)
Referred to GI to consider colonoscopy.  Discussed that it would seem unlikely for him to have developed a significant lesion since his colonoscopy 2 years ago though he does need to have this checked by GI.

## 2022-05-01 NOTE — Assessment & Plan Note (Addendum)
This continues to be an issue.  His urine is not overly convincing for UTI.  We will send his urine for culture.  I am concerned that he may have prostatitis.  We will start him on Bactrim.  We will follow-up with him by phone in 1 week to see how he is doing on this antibiotic.  I did discuss the risk of diarrhea with antibiotic use.  He was encouraged to eat yogurt or take a probiotic while on the antibiotic.  I did encourage him to avoid intercourse until this issue is feeling better.

## 2022-05-01 NOTE — Progress Notes (Signed)
Eric Rumps, MD Phone: [phone]  Eric Stevens is a 51 y.o. male who presents today for f/u.  HYPERTENSION Disease Monitoring Home BP Monitoring not checking Chest pain- no    Dyspnea- no Medications Compliance-  taking amlodipine, HCTZ. Edema- no BMET    Component Value Date/Time   NA 138 04/08/2022 0800   NA 142 11/20/2014 1840   K 3.9 04/08/2022 0800   K 3.9 11/20/2014 1840   CL 105 04/08/2022 0800   CL 107 11/20/2014 1840   CO2 25 04/08/2022 0800   CO2 31 11/20/2014 1840   GLUCOSE 159 (H) 04/08/2022 0800   GLUCOSE 82 11/20/2014 1840   BUN 12 04/08/2022 0800   BUN 14 11/20/2014 1840   CREATININE 1.07 04/08/2022 0800   CREATININE 1.18 10/20/2020 1543   CALCIUM 9.2 04/08/2022 0800   CALCIUM 9.4 11/20/2014 1840   GFRNONAA >60 04/08/2022 0800   GFRNONAA >60 11/20/2014 1840   GFRAA >60 09/22/2016 0959   GFRAA >60 11/20/2014 1840   DIABETES Disease Monitoring: Blood Sugar ranges-not checking Polyuria/phagia/dipsia- no      Optho- due Medications: Compliance- taking metformin Hypoglycemic symptoms- no  Right low back pain: Patient notes this has been bothering him recently.  He does lots of lifting and some shoveling at work and wonders if he may have done something then.  He notes no radiation, numbness, weakness, or incontinence.  Urinary urgency/abnormal CT scan: Patient was evaluated in the emergency department and diagnosed with cystitis.  He was treated with Bactrim.  His urine culture was negative.  He notes he did not finish the antibiotic though did note that it seemed to improve his symptoms.  He notes some urgency now and had some tension in his suprapubic region when he had the issues previously and notes this has returned to a certain degree.  He notes no dysuria.  No change to chronic urinary frequency related to his HCTZ.  No abdominal pain.  He notes this feels like a UTI to him.  He does have a history of prostatitis.  He does have a bowel movement  daily.  He notes no blood in his stool.  He saw a urologist who ordered a CT scan that did not reveal a urologic cause for his symptoms.  It did reveal a short segment of sigmoid colonic wall thickening versus underdistention in the region of colonic diverticula.  Colonoscopy was recommended.  He does report the urologist checked his prostate and advised him it was normal.   Social History   Tobacco Use  Smoking Status Former   Years: 5.00   Types: Cigarettes  Smokeless Tobacco Never    Current Outpatient Medications on File Prior to Visit  Medication Sig Dispense Refill   Accu-Chek Softclix Lancets lancets Use as instructed 100 each 12   amLODipine (NORVASC) 10 MG tablet Take 1 tablet (10 mg total) by mouth daily. 90 tablet 1   ANUCORT-HC 25 MG suppository INSERT 1 SUPPOSITORY RECTALLY ONCE DAILY     atorvastatin (LIPITOR) 40 MG tablet TAKE 1 TABLET(40 MG) BY MOUTH DAILY 90 tablet 1   glucose blood test strip Use as instructed with One Touch Verio to test blood sugars twice daily. Dx: E11.9 100 each 5   hydrochlorothiazide (HYDRODIURIL) 25 MG tablet Take 1 tablet (25 mg total) by mouth daily. 90 tablet 1   hydrocortisone (ANUSOL-HC) 2.5 % rectal cream Place 1 application rectally 2 (two) times daily. 30 g 0   metFORMIN (GLUCOPHAGE XR) 500 MG  24 hr tablet Take 2 tablets (1,000 mg total) by mouth daily with breakfast. 180 tablet 2   telmisartan (MICARDIS) 80 MG tablet Take 1 tablet (80 mg total) by mouth daily. 90 tablet 1   dicyclomine (BENTYL) 10 MG capsule Take 1 capsule (10 mg total) by mouth 3 (three) times daily as needed (abd pain). (Patient not taking: Reported on 05/01/2022) 25 capsule 0   scopolamine (TRANSDERM-SCOP) 1 MG/3DAYS Place 1 patch (1.5 mg total) onto the skin every 3 (three) days. While on cruise. (Patient not taking: Reported on 05/01/2022) 10 patch 0   No current facility-administered medications on file prior to visit.     ROS see history of present  illness  Objective  Physical Exam Vitals:   05/01/22 1523  BP: 128/82  Pulse: (!) 102  Temp: 97.9 F (36.6 C)  SpO2: 96%    BP Readings from Last 3 Encounters:  05/01/22 128/82  04/08/22 (!) 137/101  11/19/21 (!) 148/92   Wt Readings from Last 3 Encounters:  05/01/22 (!) 317 lb 12.8 oz (144.2 kg)  04/08/22 (!) 300 lb 0.7 oz (136.1 kg)  11/19/21 300 lb (136.1 kg)    Physical Exam Constitutional:      General: He is not in acute distress.    Appearance: He is not diaphoretic.  Cardiovascular:     Rate and Rhythm: Normal rate and regular rhythm.     Heart sounds: Normal heart sounds.  Pulmonary:     Effort: Pulmonary effort is normal.     Breath sounds: Normal breath sounds.  Abdominal:     General: Bowel sounds are normal. There is no distension.     Palpations: Abdomen is soft.     Tenderness: There is no abdominal tenderness.  Musculoskeletal:     Comments: No midline spine tenderness, no midline spine step-off, no muscular back tenderness  Skin:    General: Skin is warm and dry.  Neurological:     Mental Status: He is alert.      Assessment/Plan: Please see individual problem list.  Problem List Items Addressed This Visit     Diabetes mellitus, type II ([place]) (Chronic)    Above goal.  He will continue metformin XR 1000 mg daily.  I did discuss the option of adding additional medication versus working on dietary changes.  He opted for dietary changes.  He was given dietary guidelines.  He will follow-up in 3 months.      Relevant Orders   POCT HgB A1C (Completed)   Abnormal CT of the abdomen    Referred to GI to consider colonoscopy.  Discussed that it would seem unlikely for him to have developed a significant lesion since his colonoscopy 2 years ago though he does need to have this checked by GI.      Relevant Orders   Ambulatory referral to Gastroenterology   Family history of prostate cancer in father    He reports the urologist he saw did blood  work.  We will request notes and evaluation from them to see if they checked a PSA.      Low back pain    Likely related to muscular strain.  I discussed the options of completing physical therapy versus monitoring.  The patient opted to monitor.  If it does not improve or if it worsens he will let us know.      Urinary urgency - Primary    This continues to be an issue.  His urine is not  overly convincing for UTI.  We will send his urine for culture.  I am concerned that he may have prostatitis.  We will start him on Bactrim.  We will follow-up with him by phone in 1 week to see how he is doing on this antibiotic.  I did discuss the risk of diarrhea with antibiotic use.  He was encouraged to eat yogurt or take a probiotic while on the antibiotic.  I did encourage him to avoid intercourse until this issue is feeling better.      Relevant Medications   sulfamethoxazole-trimethoprim (BACTRIM DS) 800-160 MG tablet   Other Relevant Orders   POCT Urinalysis Dipstick (Completed)   Urine Culture   Other Visit Diagnoses     Abnormal urinalysis       Relevant Orders   Protein / creatinine ratio, urine       Return in about 3 months (around 08/01/2022) for DM.  I have spent 32 minutes in the care of this patient regarding history taking, documentation, review of recent ED visit, completion of exam, discussion of plan, placing orders.   Eric Rumps, MD [place]

## 2022-05-01 NOTE — Assessment & Plan Note (Signed)
He reports the urologist he saw did blood work.  We will request notes and evaluation from them to see if they checked a PSA.

## 2022-05-01 NOTE — Patient Instructions (Addendum)
Nice to see you. We will refer you to GI. I am going to go ahead and start you on antibiotics as I think you may have a prostate infection that is contributing to your symptoms.  We will see how you do on the antibiotics.  Please try to take a probiotic or eat some yogurt while you are on antibiotics as there is some risk of diarrhea. Please monitor your back and if it worsens or does not improve please let us know. Please work on [employer] as outlined below.   Diet Recommendations  Starchy (carb) foods: Bread, rice, pasta, potatoes, corn, cereal, grits, crackers, bagels, muffins, all baked goods.  (Fruits, milk, and yogurt also have carbohydrate, but most of these foods will not spike your blood sugar as the starchy foods will.)  A few fruits do cause high blood sugars; use small portions of bananas (limit to 1/2 at a time), grapes, watermelon, oranges, and most tropical fruits.    Protein foods: Meat, fish, poultry, eggs, dairy foods, and beans such as pinto and kidney beans (beans also provide carbohydrate).   1. Eat at least 3 meals and 1-2 snacks per day. Never go more than 4-5 hours while awake without eating. Eat breakfast within the first hour of getting up.   2. Limit starchy foods to TWO per meal and ONE per snack. ONE portion of a starchy  food is equal to the following:   - ONE slice of bread (or its equivalent, such as half of a hamburger bun).   - 1/2 cup of a "scoopable" starchy food such as potatoes or rice.   - 15 grams of carbohydrate as shown on food label.  3. Include at every meal: a protein food, a carb food, and vegetables and/or fruit.   - Obtain twice the volume of veg's as protein or carbohydrate foods for both lunch and dinner.   - Fresh or frozen veg's are best.   - Keep frozen veg's on hand for a quick vegetable serving.

## 2022-05-01 NOTE — Assessment & Plan Note (Signed)
Likely related to muscular strain.  I discussed the options of completing physical therapy versus monitoring.  The patient opted to monitor.  If it does not improve or if it worsens he will let us know.

## 2022-05-02 LAB — URINE CULTURE
MICRO NUMBER:: 13524862
Result:: NO GROWTH
SPECIMEN QUALITY:: ADEQUATE

## 2022-05-02 LAB — PROTEIN / CREATININE RATIO, URINE
Creatinine, Urine: 168 mg/dL (ref 20–320)
Protein/Creat Ratio: 167 mg/g creat — ABNORMAL HIGH (ref 25–148)
Protein/Creatinine Ratio: 0.167 mg/mg creat — ABNORMAL HIGH (ref 0.025–0.148)
Total Protein, Urine: 28 mg/dL — ABNORMAL HIGH (ref 5–25)

## 2022-05-07 ENCOUNTER — Telehealth: Payer: Self-pay

## 2022-05-08 ENCOUNTER — Telehealth: Payer: Self-pay | Admitting: Family Medicine

## 2022-05-08 DIAGNOSIS — R3915 Urgency of urination: Secondary | ICD-10-CM

## 2022-05-08 NOTE — Telephone Encounter (Signed)
Please call the patient and see how his urinary symptoms have gone on the Bactrim.  Thanks.

## 2022-05-08 NOTE — Telephone Encounter (Signed)
I called and spoke with the patient and he stated his symptoms has gone with the Batrim.  Shulem Mader,cma

## 2022-05-08 NOTE — Telephone Encounter (Signed)
-----   Message from [name], MD sent at 05/01/2022  4:11 PM EDT ----- Call patient to see how he has done on the antibiotic.

## 2022-05-09 ENCOUNTER — Other Ambulatory Visit: Payer: Self-pay | Admitting: Family Medicine

## 2022-05-09 DIAGNOSIS — R809 Proteinuria, unspecified: Secondary | ICD-10-CM

## 2022-05-09 MED ORDER — SULFAMETHOXAZOLE-TRIMETHOPRIM 800-160 MG PO TABS: 1.0000 | ORAL_TABLET | Freq: Two times a day (BID) | ORAL | 0 refills | Status: DC

## 2022-05-09 NOTE — Addendum Note (Signed)
Addended by: [name] on: 05/09/2022 10:25 AM   Modules accepted: Orders

## 2022-05-09 NOTE — Telephone Encounter (Signed)
Noted.  I am glad to hear his symptoms have resolved.  It is likely that he has prostatitis.  He needs to complete at least 4 weeks of antibiotics.  I sent in another 2 weeks of Bactrim for him to take once he completes the first prescription.  He should take a probiotic or eat yogurt while on the antibiotic as that may reduce any risk of diarrhea or a GI infection.  If he has any diarrhea or notices any side effects while taking this medication he needs to let us know.

## 2022-05-28 ENCOUNTER — Other Ambulatory Visit: Payer: Self-pay | Admitting: Family Medicine

## 2022-05-28 DIAGNOSIS — R3915 Urgency of urination: Secondary | ICD-10-CM

## 2022-05-28 DIAGNOSIS — I10 Essential (primary) hypertension: Secondary | ICD-10-CM

## 2022-05-29 ENCOUNTER — Other Ambulatory Visit: Payer: Self-pay

## 2022-05-29 DIAGNOSIS — E785 Hyperlipidemia, unspecified: Secondary | ICD-10-CM

## 2022-05-29 DIAGNOSIS — Z8042 Family history of malignant neoplasm of prostate: Secondary | ICD-10-CM

## 2022-05-29 DIAGNOSIS — I10 Essential (primary) hypertension: Secondary | ICD-10-CM

## 2022-05-29 DIAGNOSIS — E119 Type 2 diabetes mellitus without complications: Secondary | ICD-10-CM

## 2022-05-29 DIAGNOSIS — Z125 Encounter for screening for malignant neoplasm of prostate: Secondary | ICD-10-CM

## 2022-05-29 NOTE — Telephone Encounter (Signed)
I called and LVM for patient to call back to ask about the antibiotic request.  Eric Stevens,cma

## 2022-05-29 NOTE — Addendum Note (Signed)
Addended by: [name], Sebastin Perlmutter G on: 05/29/2022 03:07 PM   Modules accepted: Orders

## 2022-05-29 NOTE — Telephone Encounter (Signed)
Please see why this was requested. He was to finish a 4 week course and be done with this antibiotic.

## 2022-07-06 ENCOUNTER — Other Ambulatory Visit: Payer: Self-pay | Admitting: Family Medicine

## 2022-07-09 DIAGNOSIS — I10 Essential (primary) hypertension: Secondary | ICD-10-CM | POA: Diagnosis not present

## 2022-07-09 DIAGNOSIS — R944 Abnormal results of kidney function studies: Secondary | ICD-10-CM | POA: Diagnosis not present

## 2022-08-02 ENCOUNTER — Other Ambulatory Visit (INDEPENDENT_AMBULATORY_CARE_PROVIDER_SITE_OTHER): Payer: BC Managed Care – PPO

## 2022-08-02 DIAGNOSIS — E785 Hyperlipidemia, unspecified: Secondary | ICD-10-CM | POA: Diagnosis not present

## 2022-08-02 DIAGNOSIS — E119 Type 2 diabetes mellitus without complications: Secondary | ICD-10-CM | POA: Diagnosis not present

## 2022-08-02 DIAGNOSIS — I10 Essential (primary) hypertension: Secondary | ICD-10-CM

## 2022-08-02 DIAGNOSIS — Z125 Encounter for screening for malignant neoplasm of prostate: Secondary | ICD-10-CM

## 2022-08-02 LAB — LIPID PANEL
Cholesterol: 92 mg/dL (ref 0–200)
HDL: 34.5 mg/dL — ABNORMAL LOW (ref >39.00)
LDL Cholesterol: 39 mg/dL (ref 0–99)
NonHDL: 57.69
Total CHOL/HDL Ratio: 3
Triglycerides: 95 mg/dL (ref 0.0–149.0)
VLDL: 19 mg/dL (ref 0.0–40.0)

## 2022-08-02 LAB — COMPREHENSIVE METABOLIC PANEL
ALT: 73 U/L — ABNORMAL HIGH (ref 0–53)
AST: 60 U/L — ABNORMAL HIGH (ref 0–37)
Albumin: 4.3 g/dL (ref 3.5–5.2)
Alkaline Phosphatase: 62 U/L (ref 39–117)
BUN: 14 mg/dL (ref 6–23)
CO2: 27 mEq/L (ref 19–32)
Calcium: 9.9 mg/dL (ref 8.4–10.5)
Chloride: 99 mEq/L (ref 96–112)
Creatinine, Ser: 1.26 mg/dL (ref 0.40–1.50)
GFR: 66.84 mL/min (ref >60.00)
Glucose, Bld: 140 mg/dL — ABNORMAL HIGH (ref 70–99)
Potassium: 3.7 mEq/L (ref 3.5–5.1)
Sodium: 137 mEq/L (ref 135–145)
Total Bilirubin: 0.8 mg/dL (ref 0.2–1.2)
Total Protein: 7.3 g/dL (ref 6.0–8.3)

## 2022-08-02 LAB — PSA: PSA: 0.35 ng/mL (ref 0.10–4.00)

## 2022-08-02 LAB — HEMOGLOBIN A1C: Hgb A1c MFr Bld: 7.5 % — ABNORMAL HIGH (ref 4.6–6.5)

## 2022-08-14 ENCOUNTER — Encounter: Payer: Self-pay | Admitting: Family Medicine

## 2022-08-14 ENCOUNTER — Ambulatory Visit (INDEPENDENT_AMBULATORY_CARE_PROVIDER_SITE_OTHER): Payer: BC Managed Care – PPO | Admitting: Family Medicine

## 2022-08-14 VITALS — BP 140/80 | HR 107 | Temp 98.5°F | Ht 74.0 in | Wt 307.6 lb

## 2022-08-14 DIAGNOSIS — R0981 Nasal congestion: Secondary | ICD-10-CM

## 2022-08-14 DIAGNOSIS — O9852 Other viral diseases complicating childbirth: Secondary | ICD-10-CM

## 2022-08-14 DIAGNOSIS — Z0001 Encounter for general adult medical examination with abnormal findings: Secondary | ICD-10-CM | POA: Diagnosis not present

## 2022-08-14 DIAGNOSIS — U071 COVID-19: Secondary | ICD-10-CM | POA: Diagnosis not present

## 2022-08-14 DIAGNOSIS — I10 Essential (primary) hypertension: Secondary | ICD-10-CM | POA: Diagnosis not present

## 2022-08-14 DIAGNOSIS — E119 Type 2 diabetes mellitus without complications: Secondary | ICD-10-CM

## 2022-08-14 DIAGNOSIS — R748 Abnormal levels of other serum enzymes: Secondary | ICD-10-CM

## 2022-08-14 LAB — POCT INFLUENZA A/B
Influenza A, POC: NEGATIVE
Influenza B, POC: NEGATIVE

## 2022-08-14 LAB — POC COVID19 BINAXNOW: SARS Coronavirus 2 Ag: POSITIVE — AB

## 2022-08-14 MED ORDER — DAPAGLIFLOZIN PROPANEDIOL 5 MG PO TABS: 5.0000 mg | ORAL_TABLET | Freq: Every day | ORAL | 3 refills | Status: DC

## 2022-08-14 MED ORDER — NIRMATRELVIR/RITONAVIR (PAXLOVID)TABLET: 3.0000 | ORAL_TABLET | Freq: Two times a day (BID) | ORAL | 0 refills | Status: AC

## 2022-08-14 NOTE — Assessment & Plan Note (Signed)
Blood pressure is slightly elevated today though he is ill.  We will have him return for nurse blood pressure check in a couple of weeks once he is recovered.  He will continue his current blood pressure medications.

## 2022-08-14 NOTE — Assessment & Plan Note (Signed)
We will start Farxiga 5 mg daily.  Discussed the risk of UTI and genital yeast infections with this.  Discussed sick day rules.

## 2022-08-14 NOTE — Progress Notes (Signed)
[name], MD Phone: [phone]  Eric Stevens is a 51 y.o. male who presents today for CPE.  Diet: general american diet, does get plenty of fruits and vegetables Exercise: active with job though no other exercise Colonoscopy: 05/12/20 5 year recall Prostate cancer screening: UTD Family history-  Prostate cancer: father  Colon cancer: no Vaccines-   Flu: declines  Tetanus: UTD  COVID19: declines HIV screening: donated blood previously Hep C Screening: UTD Tobacco use: notes he vapes Alcohol use: none since we called about his labs Illicit Drug use: no Dentist: occasionally  Ophthalmology: occasionally  Cough: Patient notes cough started yesterday with some congestion and feeling achy.  He has had some sweating.  No fevers.  No shortness of breath.  Diabetes: Patient has been taking metformin 1000 mg daily.  He notes some GI upset with this dosing.   Active Ambulatory Problems    Diagnosis Date Noted   Hypertension 12/15/2012   Erectile dysfunction 12/15/2012   Family history of brain aneurysm 12/15/2012   Seasonal allergies 02/15/2013   Epigastric pain 09/10/2013   Elevated liver enzymes 10/04/2013   Diabetes mellitus, type II ([place]) 03/31/2014   Obesity (BMI 30-39.9) 03/31/2014   Unspecified vitamin D deficiency 04/13/2014   GERD (gastroesophageal reflux disease) 01/07/2017   History of rectal bleeding 12/01/2017   Family history of prostate cancer in father 12/01/2017   Fever 04/26/2019   Hyperlipidemia 02/04/2020   Encounter for screening colonoscopy 02/04/2020   Encounter for general adult medical examination with abnormal findings 06/14/2021   Nicotine dependence, other tobacco product, uncomplicated 50/35/4656   Hemorrhoids 06/14/2021   Urinary urgency 05/01/2022   Abnormal CT of the abdomen 05/01/2022   Low back pain 05/01/2022   Resolved Ambulatory Problems    Diagnosis Date Noted   Obstructive sleep apnea 02/05/2013   Left maxillary  sinusitis 03/10/2013   Diarrhea 04/14/2013   Rash 06/28/2013   Shortness of breath 04/07/2014   Right wrist pain 04/13/2014   Toenail fungus 05/19/2014   Muscular chest pain 01/31/2016   Viral URI with cough 01/31/2016   Diabetes mellitus without complication ([place]) 81/27/5170   Past Medical History:  Diagnosis Date   Prostatitis    Sleep apnea     Family History  Problem Relation Age of Onset   Hypertension Father    Cancer Father        prostate, brain   Diabetes Brother    Aneurysm Mother        brain   Hypertension Mother     Social History   Socioeconomic History   Marital status: Married    Spouse name: Not on file   Number of children: Not on file   Years of education: Not on file   Highest education level: Not on file  Occupational History   Not on file  Tobacco Use   Smoking status: Former    Years: 5.00    Types: Cigarettes   Smokeless tobacco: Never  Vaping Use   Vaping Use: Every day  Substance and Sexual Activity   Alcohol use: Yes    Comment: occassionally none last 2 days   Drug use: No   Sexual activity: Not on file  Other Topics Concern   Not on file  Social History Narrative   Lives with wife in [place]. Children, Marland and 14YO.      Work - Biomedical scientist truck      Diet - regular, limited red meat  Exercise - occasionally   Social Determinants of Health   Financial Resource Strain: Not on file  Food Insecurity: Not on file  Transportation Needs: Not on file  Physical Activity: Not on file  Stress: Not on file  Social Connections: Not on file  Intimate Partner Violence: Not on file    ROS  General:  Negative for nexplained weight loss, fever Skin: Negative for new or changing mole, sore that won't heal HEENT: Negative for trouble hearing, trouble seeing, ringing in ears, mouth sores, hoarseness, change in voice, dysphagia. CV:  Negative for chest pain, dyspnea, edema, palpitations Resp: Negative for cough, dyspnea,  hemoptysis GI: Negative for nausea, vomiting, diarrhea, constipation, abdominal pain, melena, hematochezia. GU: Negative for dysuria, incontinence, urinary hesitance, hematuria, vaginal or penile discharge, polyuria, sexual difficulty, lumps in testicle or breasts MSK: Negative for muscle cramps or aches, joint pain or swelling Neuro: Negative for headaches, weakness, numbness, dizziness, passing out/fainting Psych: Negative for depression, anxiety, memory problems  Objective  Physical Exam Vitals:   08/14/22 1608  BP: (!) 140/80  Pulse: (!) 107  Temp: 98.5 F (36.9 C)  SpO2: 99%    BP Readings from Last 3 Encounters:  08/14/22 (!) 140/80  05/01/22 128/82  04/08/22 (!) 137/101   Wt Readings from Last 3 Encounters:  08/14/22 (!) 307 lb 9.6 oz (139.5 kg)  05/01/22 (!) 317 lb 12.8 oz (144.2 kg)  04/08/22 (!) 300 lb 0.7 oz (136.1 kg)    Physical Exam Constitutional:      General: He is not in acute distress.    Appearance: He is not diaphoretic.  HENT:     Ears:     Comments: Cerumen obscures TMs bilaterally Cardiovascular:     Rate and Rhythm: Normal rate and regular rhythm.     Heart sounds: Normal heart sounds.  Pulmonary:     Effort: Pulmonary effort is normal.     Breath sounds: Normal breath sounds.  Abdominal:     General: Bowel sounds are normal. There is no distension.     Palpations: Abdomen is soft.     Tenderness: There is no abdominal tenderness.  Musculoskeletal:     Right lower leg: No edema.     Left lower leg: No edema.  Lymphadenopathy:     Cervical: No cervical adenopathy.  Skin:    General: Skin is warm and dry.  Neurological:     Mental Status: He is alert.  Psychiatric:        Mood and Affect: Mood normal.      Assessment/Plan:   Problem List Items Addressed This Visit   None   No follow-ups on file.   [name], MD [place]

## 2022-08-14 NOTE — Patient Instructions (Signed)
Nice to see you. You have been diagnosed with COVID-19.  You need to remain quarantined at least through 08/18/2022.  If your symptoms have improved significantly or have resolved by that time you can come off of quarantine the next day.  If your symptoms persist at that time you need to remain quarantine for a full 10 days after the onset of your symptoms. I sent Paxlovid in for you to start on for treatment.  If you develop increasing blood pressure while on this or if you start to have lightheadedness while on this you need to let us know.  If you develop excessive diarrhea while on this please let us know. If you develop chest pain, shortness of breath, cough productive of blood, or fevers of 103 F or higher please seek medical attention in person.

## 2022-08-14 NOTE — Assessment & Plan Note (Signed)
Physical exam completed.  Encouraged healthy diet and exercise.  Discussed increasing his exercise outside of his job.  Lab work was reviewed with the patient.  Colonoscopy is up-to-date.  He declines COVID and flu vaccinations.  I encouraged him to quit smoking e-cigarettes.  He will abstain from alcohol.  We will plan on repeating his liver enzymes once he is feeling better.

## 2022-08-14 NOTE — Assessment & Plan Note (Signed)
Could be related to alcohol consumption though it did not sound as though he was drinking all that much alcohol previously.  We will recheck this in a couple of weeks once he is recovered from his current viral illness.

## 2022-08-15 ENCOUNTER — Telehealth: Payer: Self-pay | Admitting: Family Medicine

## 2022-08-15 DIAGNOSIS — U071 COVID-19: Secondary | ICD-10-CM | POA: Insufficient documentation

## 2022-08-15 DIAGNOSIS — R7989 Other specified abnormal findings of blood chemistry: Secondary | ICD-10-CM

## 2022-08-15 NOTE — Telephone Encounter (Signed)
Can you please call the patient or have somebody else call him and get him scheduled for follow-up?  He needs a 2-week lab appointment and nurse visit for blood pressure check and 60-monthfollow-up with me.  Thanks.

## 2022-08-15 NOTE — Telephone Encounter (Signed)
I called the patient and scheduled a nurse visit in 2 weeks for a BP check and labs and I also scheduled a 3 month follow up with the patient on the phone.  Eric Stevens,cma

## 2022-08-15 NOTE — Assessment & Plan Note (Signed)
Patient with COVID-19.  Patient is high risk given his weight and other medical issues.  Discussed treatment with Paxlovid.  Discussed potential side effects of elevated blood pressure, myalgias, diarrhea, and altered taste.  Discussed that the Paxlovid could increase the concentration of his calcium channel blocker medications and thus that potentially could balance out any hypertensive effects of the Paxlovid.  Discussed if he starts to have significantly elevated blood pressures or low blood pressures or lightheadedness while in the Paxlovid he needs to let us know.  Discussed the purpose of this medication is to reduce his risk of death or ending up in the hospital.  Advised he may start to feel better a little sooner.  Discussed he needs to remain quarantined at least through 08/18/2022.  If his symptoms have resolved or are significantly improving, off of quarantine and wear a mask for the following 5 days.  If his symptoms have not improved significantly he will remain quarantine for full 10 days after the onset of his symptoms.  Advised to seek medical attention for shortness of breath, cough productive of blood, and fever of 103 F or higher.  Discussed if anyone else at home develops him they need to get tested for COVID.  I discussed he should not start the [place] until he starts to feel better given the potential for poor oral intake while he is sick.

## 2022-08-26 ENCOUNTER — Ambulatory Visit: Payer: BC Managed Care – PPO

## 2022-09-11 ENCOUNTER — Telehealth: Payer: Self-pay | Admitting: Family Medicine

## 2022-09-11 DIAGNOSIS — R944 Abnormal results of kidney function studies: Secondary | ICD-10-CM | POA: Diagnosis not present

## 2022-09-11 DIAGNOSIS — I10 Essential (primary) hypertension: Secondary | ICD-10-CM | POA: Diagnosis not present

## 2022-09-11 NOTE — Telephone Encounter (Signed)
Pt called stating the provider sent him some blood sugar medication to the pharmacy and the pt stated he can not afford it. Pt forgot what the medication was called

## 2022-09-12 NOTE — Telephone Encounter (Signed)
We have the 10 mg of the [place] and I can't see the pill but I think it can be broke in half, what's your thoughts? Teale Goodgame,cma

## 2022-09-12 NOTE — Telephone Encounter (Signed)
Mailbox is full.     Maloni Musleh,cma

## 2022-09-16 MED ORDER — EMPAGLIFLOZIN 10 MG PO TABS: 10.0000 mg | ORAL_TABLET | Freq: Every day | ORAL | 2 refills | Status: DC

## 2022-09-16 NOTE — Telephone Encounter (Signed)
Noted. The issue with the samples is that we will not be able to continuously give them to him. I will send in jardiance for him. This works similarly and could be covered at a better rate by his insurance. If it is not affordable he should let us know.

## 2022-09-16 NOTE — Telephone Encounter (Signed)
Mailbox was full.  Galya Dunnigan,cma

## 2022-09-16 NOTE — Addendum Note (Signed)
Addended by: [name], Martavion Couper G on: 09/16/2022 12:50 PM   Modules accepted: Orders

## 2022-09-17 ENCOUNTER — Ambulatory Visit (INDEPENDENT_AMBULATORY_CARE_PROVIDER_SITE_OTHER): Payer: BC Managed Care – PPO

## 2022-09-17 ENCOUNTER — Other Ambulatory Visit: Payer: Self-pay

## 2022-09-17 ENCOUNTER — Telehealth: Payer: Self-pay

## 2022-09-17 DIAGNOSIS — R7989 Other specified abnormal findings of blood chemistry: Secondary | ICD-10-CM | POA: Diagnosis not present

## 2022-09-17 DIAGNOSIS — I10 Essential (primary) hypertension: Secondary | ICD-10-CM

## 2022-09-17 NOTE — Telephone Encounter (Signed)
I called and spoke with the patient and he is okay with trying the Jardiance and if he cannot afford it he will let us know.  Patient and he missed his lab appointment and nurse visit to check his BP and will call back and reschedule due to his work schedule.  Kenitha Glendinning,cma

## 2022-09-17 NOTE — Telephone Encounter (Signed)
Patient states he would like to have an appointment for blood pressure check and labs.  I see an order in the system for labs, but not the blood pressure check.

## 2022-09-17 NOTE — Progress Notes (Signed)
Patient here for nurse visit BP check per order from Tele note 08/15/22.   Patient reports compliance with prescribed BP medications: yes, Amlodipine 10 mg once daily & Losartan 50 mg once daily given by nephro  Last dose of BP medication: This morning  BP Readings from Last 3 Encounters:  09/17/22 110/80  08/14/22 (!) 140/80  05/01/22 128/82   Pulse Readings from Last 3 Encounters:  08/14/22 (!) 107  05/01/22 (!) 102  04/08/22 89   Notified pt that note w/readings would be sent to for review & CMA would be reaching out to pt in regards to next steps or change in therapy.   Patient verbalized understanding of instructions.   Elita Quick Dimas,CMA

## 2022-09-18 LAB — BASIC METABOLIC PANEL
BUN: 16 mg/dL (ref 6–23)
CO2: 29 mEq/L (ref 19–32)
Calcium: 10.4 mg/dL (ref 8.4–10.5)
Chloride: 99 mEq/L (ref 96–112)
Creatinine, Ser: 1.2 mg/dL (ref 0.40–1.50)
GFR: 70.81 mL/min (ref >60.00)
Glucose, Bld: 83 mg/dL (ref 70–99)
Potassium: 3.9 mEq/L (ref 3.5–5.1)
Sodium: 139 mEq/L (ref 135–145)

## 2022-09-18 LAB — HEPATIC FUNCTION PANEL
ALT: 63 U/L — ABNORMAL HIGH (ref 0–53)
AST: 44 U/L — ABNORMAL HIGH (ref 0–37)
Albumin: 4.5 g/dL (ref 3.5–5.2)
Alkaline Phosphatase: 69 U/L (ref 39–117)
Bilirubin, Direct: 0.1 mg/dL (ref 0.0–0.3)
Total Bilirubin: 0.5 mg/dL (ref 0.2–1.2)
Total Protein: 7.6 g/dL (ref 6.0–8.3)

## 2022-09-23 ENCOUNTER — Other Ambulatory Visit: Payer: Self-pay | Admitting: Family Medicine

## 2022-09-23 DIAGNOSIS — R7989 Other specified abnormal findings of blood chemistry: Secondary | ICD-10-CM

## 2022-09-23 DIAGNOSIS — R944 Abnormal results of kidney function studies: Secondary | ICD-10-CM | POA: Diagnosis not present

## 2022-09-24 ENCOUNTER — Ambulatory Visit: Payer: BC Managed Care – PPO | Admitting: Gastroenterology

## 2022-10-15 LAB — HM DIABETES EYE EXAM

## 2022-10-18 ENCOUNTER — Telehealth (INDEPENDENT_AMBULATORY_CARE_PROVIDER_SITE_OTHER): Payer: BC Managed Care – PPO | Admitting: Family Medicine

## 2022-10-18 DIAGNOSIS — I10 Essential (primary) hypertension: Secondary | ICD-10-CM

## 2022-10-18 DIAGNOSIS — E119 Type 2 diabetes mellitus without complications: Secondary | ICD-10-CM | POA: Diagnosis not present

## 2022-10-18 NOTE — Progress Notes (Signed)
Virtual Visit via telephone Note  This visit type was conducted due to national recommendations for restrictions regarding the COVID-19 pandemic (e.g. social distancing).  This format is felt to be most appropriate for this patient at this time.  All issues noted in this document were discussed and addressed.  No physical exam was performed (except for noted visual exam findings with Video Visits).   I connected with Eric Stevens today at  3:30 PM EST by telephone and verified that I am speaking with the correct person using two identifiers. Location patient: car Location provider: work  Persons participating in the virtual visit: patient, provider  I discussed the limitations, risks, security and privacy concerns of performing an evaluation and management service by telephone and the availability of in person appointments. I also discussed with the patient that there may be a patient responsible charge related to this service. The patient expressed understanding and agreed to proceed.  Interactive audio and video telecommunications were attempted between this provider and patient, however failed, due to patient having technical difficulties OR patient did not have access to video capability.  We continued and completed visit with audio only.   Reason for visit: f/u.  HPI: HYPERTENSION Disease Monitoring Home BP Monitoring not checking, does not have a cuff that is big enough for his arm Chest pain- no    Dyspnea- no Medications Compliance-  taking amlodipine, HCTZ, telmisartan.   Edema- no BMET    Component Value Date/Time   NA 139 09/17/2022 1426   NA 142 11/20/2014 1840   K 3.9 09/17/2022 1426   K 3.9 11/20/2014 1840   CL 99 09/17/2022 1426   CL 107 11/20/2014 1840   CO2 29 09/17/2022 1426   CO2 31 11/20/2014 1840   GLUCOSE 83 09/17/2022 1426   GLUCOSE 82 11/20/2014 1840   BUN 16 09/17/2022 1426   BUN 14 11/20/2014 1840   CREATININE 1.20 09/17/2022 1426   CREATININE 1.18  10/20/2020 1543   CALCIUM 10.4 09/17/2022 1426   CALCIUM 9.4 11/20/2014 1840   GFRNONAA >60 04/08/2022 0800   GFRNONAA >60 11/20/2014 1840   GFRAA >60 09/22/2016 0959   GFRAA >60 11/20/2014 1840   DIABETES Disease Monitoring: Blood Sugar ranges-not checking Polyuria/phagia/dipsia- no      Optho- UTD Medications: Compliance- taking metformin, could not afford jardiance Hypoglycemic symptoms- no    ROS: See pertinent positives and negatives per HPI.  Past Medical History:  Diagnosis Date   Diabetes mellitus without complication ([place])    Hypertension    Prostatitis    Dr. Eliberto Ivory   Sleep apnea     Past Surgical History:  Procedure Laterality Date   COLONOSCOPY     COLONOSCOPY WITH PROPOFOL N/A 05/12/2020   Procedure: COLONOSCOPY WITH PROPOFOL;  Surgeon: Lin Landsman, MD;  Location: [facility] ENDOSCOPY;  Service: Gastroenterology;  Laterality: N/A;    Family History  Problem Relation Age of Onset   Hypertension Father    Cancer Father        prostate, brain   Diabetes Brother    Aneurysm Mother        brain   Hypertension Mother     SOCIAL HX: former smoker   Current Outpatient Medications:    Accu-Chek Softclix Lancets lancets, Use as instructed, Disp: 100 each, Rfl: 12   amLODipine (NORVASC) 10 MG tablet, Take 1 tablet (10 mg total) by mouth daily., Disp: 90 tablet, Rfl: 1   ANUCORT-HC 25 MG suppository, INSERT 1 SUPPOSITORY RECTALLY  ONCE DAILY, Disp: , Rfl:    atorvastatin (LIPITOR) 40 MG tablet, TAKE 1 TABLET(40 MG) BY MOUTH DAILY, Disp: 90 tablet, Rfl: 1   dicyclomine (BENTYL) 10 MG capsule, Take 1 capsule (10 mg total) by mouth 3 (three) times daily as needed (abd pain)., Disp: 25 capsule, Rfl: 0   diltiazem (CARDIZEM CD) 240 MG 24 hr capsule, TAKE 1 CAPSULE(240 MG) BY MOUTH DAILY, Disp: 90 capsule, Rfl: 1   glucose blood test strip, Use as instructed with One Touch Verio to test blood sugars twice daily. Dx: E11.9, Disp: 100 each, Rfl: 5   hydrochlorothiazide  (HYDRODIURIL) 25 MG tablet, TAKE 1 TABLET(25 MG) BY MOUTH DAILY, Disp: 90 tablet, Rfl: 1   hydrocortisone (ANUSOL-HC) 2.5 % rectal cream, Place 1 application rectally 2 (two) times daily., Disp: 30 g, Rfl: 0   metFORMIN (GLUCOPHAGE XR) 500 MG 24 hr tablet, Take 2 tablets (1,000 mg total) by mouth daily with breakfast., Disp: 180 tablet, Rfl: 2   scopolamine (TRANSDERM-SCOP) 1 MG/3DAYS, Place 1 patch (1.5 mg total) onto the skin every 3 (three) days. While on cruise., Disp: 10 patch, Rfl: 0   telmisartan (MICARDIS) 80 MG tablet, Take 1 tablet (80 mg total) by mouth daily., Disp: 90 tablet, Rfl: 1  EXAM: This was a telephone visit and thus no exam was completed.  ASSESSMENT AND PLAN:  Discussed the following assessment and plan:  Problem List Items Addressed This Visit     Diabetes mellitus, type II ([place]) (Chronic)    Above goal on last check.  Cost is a concern for additional medications.  We will refer to our clinical pharmacist to see if she can assist in getting Jardiance or another second medication for him.  He will continue metformin XR 1000 mg daily.      Relevant Orders   AMB Referral to Pharmacy Medication Management   Hypertension - Primary (Chronic)    Undetermined control.  Patient understands he should start checking at home.  Will plan to check his blood pressure at his next visit.  He will continue amlodipine 10 mg daily, HCTZ 25 mg daily, and telmisartan 80 mg daily.       Return in about 3 months (around 01/17/2023).   I discussed the assessment and treatment plan with the patient. The patient was provided an opportunity to ask questions and all were answered. The patient agreed with the plan and demonstrated an understanding of the instructions.   The patient was advised to call back or seek an in-person evaluation if the symptoms worsen or if the condition fails to improve as anticipated.  I provided 8 minutes of non-face-to-face time during this encounter.   [name], MD

## 2022-10-18 NOTE — Patient Instructions (Addendum)
Please call [facility] at [phone] to reschedule your appointment.

## 2022-10-18 NOTE — Assessment & Plan Note (Signed)
Above goal on last check.  Cost is a concern for additional medications.  We will refer to our clinical pharmacist to see if she can assist in getting Jardiance or another second medication for him.  He will continue metformin XR 1000 mg daily.

## 2022-10-18 NOTE — Assessment & Plan Note (Signed)
Undetermined control.  Patient understands he should start checking at home.  Will plan to check his blood pressure at his next visit.  He will continue amlodipine 10 mg daily, HCTZ 25 mg daily, and telmisartan 80 mg daily.

## 2022-10-24 ENCOUNTER — Telehealth: Payer: Self-pay

## 2022-10-24 NOTE — Progress Notes (Signed)
   Care Guide Note  10/24/2022 Name: Eric Stevens MRN: [id number] DOB: 1971/10/11  Referred by: [name], MD Reason for referral : Care Coordination (Outreach to schedule referral with Pharm D )   Eric Stevens is a 51 y.o. year old male who is a primary care patient of [name], MD. Eric Stevens was referred to the pharmacist for assistance related to DM.    Successful contact was made with the patient to discuss pharmacy services including being ready for the pharmacist to call at least 5 minutes before the scheduled appointment time, to have medication bottles and any blood sugar or blood pressure readings ready for review. The patient agreed to meet with the pharmacist via with the pharmacist via telephone visit on (date/time).  11/07/2022  Noreene Larsson, [name], [name] [address] Direct Dial: [phone] [name]

## 2022-11-07 ENCOUNTER — Other Ambulatory Visit: Payer: BC Managed Care – PPO | Admitting: Pharmacist

## 2022-11-07 DIAGNOSIS — E119 Type 2 diabetes mellitus without complications: Secondary | ICD-10-CM

## 2022-11-07 MED ORDER — EMPAGLIFLOZIN 10 MG PO TABS: 10.0000 mg | ORAL_TABLET | Freq: Every day | ORAL | 2 refills | Status: DC

## 2022-11-07 MED ORDER — METFORMIN HCL ER 500 MG PO TB24: 1000.0000 mg | ORAL_TABLET | Freq: Two times a day (BID) | ORAL | 1 refills | Status: DC

## 2022-11-07 NOTE — Progress Notes (Signed)
11/07/2022 Name: Eric Stevens MRN: [id number] DOB: [birth year]  Chief Complaint  Patient presents with   Medication Management   Diabetes   Hypertension    Eric Stevens is a 51 y.o. year old male who presented for a telephone visit.   They were referred to the pharmacist by their PCP for assistance in managing diabetes and medication access.   Subjective:  Care Team: Primary Care Provider: [name], MD ; Next Scheduled Visit: not scheduled Nephrology: Holley Raring; Next Appointment: 01/16/23  Medication Access/Adherence  Current Pharmacy:  [facility] [place], [place] AT [address] [place] [address] Phone: [phone] Fax: [phone]   Patient reports affordability concerns with their medications: Yes  Patient reports access/transportation concerns to their pharmacy: No  Patient reports adherence concerns with their medications:  Yes    Patient was unable to afford Jardiance. Reports copay was $200.    Diabetes:  Current medications: metformin XR 1000 mg daily  Hypertension:  Current medications: losartan 50 mg daily, HCTZ 25 mg daily, diltiazem 240 mg daily, amlodipine 10 mg daily  Patient does not have a validated, automated, upper arm home BP cuff; reported previously to PCP that he needed a XL cuff   Hyperlipidemia/ASCVD Risk Reduction  Current lipid lowering medications: atorvastatin 40 mg daily   Health Maintenance  Health Maintenance Due  Topic Date Due   COVID-19 Vaccine (1) Never done   HIV Screening  Never done   FOOT EXAM  09/17/2022   Zoster Vaccines- Shingrix (1 of 2) Never done     Objective: Lab Results  Component Value Date   HGBA1C 7.5 (H) 08/02/2022    Lab Results  Component Value Date   CREATININE 1.20 09/17/2022   BUN 16 09/17/2022   NA 139 09/17/2022   K 3.9 09/17/2022   CL 99 09/17/2022   CO2 29 09/17/2022    Lab Results   Component Value Date   CHOL 92 08/02/2022   HDL 34.50 (L) 08/02/2022   LDLCALC 39 08/02/2022   LDLDIRECT 44.0 01/08/2018   TRIG 95.0 08/02/2022   CHOLHDL 3 08/02/2022    Medications Reviewed Today     Reviewed by Osker Mason, RPH-CPP (Pharmacist) on 11/07/22 at 1120  Med List Status: <None>   Medication Order Taking? Sig Documenting Provider Last Dose Status Informant  Accu-Chek Softclix Lancets lancets [id number] Yes Use as instructed [name], MD Taking Active   amLODipine (NORVASC) 10 MG tablet [id number] Yes Take 1 tablet (10 mg total) by mouth daily. [name], MD Taking Active   ANUCORT-HC 25 MG suppository [id number] No INSERT 1 SUPPOSITORY RECTALLY ONCE DAILY  Patient not taking: Reported on 11/07/2022   [provider] Not Taking Active   atorvastatin (LIPITOR) 40 MG tablet [id number] Yes TAKE 1 TABLET(40 MG) BY MOUTH DAILY Kennyth Arnold, FNP Taking Active   dicyclomine (BENTYL) 10 MG capsule [id number] No Take 1 capsule (10 mg total) by mouth 3 (three) times daily as needed (abd pain).  Patient not taking: Reported on 11/07/2022   Nance Pear, MD Not Taking Active   diltiazem [facility] CD) 240 MG 24 hr capsule [id number] Yes TAKE 1 CAPSULE(240 MG) BY MOUTH DAILY Dutch Quint B, FNP Taking Active   glucose blood test strip [id number]  Use as instructed with One Touch Verio to test blood sugars twice daily. Dx: E11.9 [name], MD  Active   hydrochlorothiazide (HYDRODIURIL) 25 MG tablet [id number] Yes TAKE 1 TABLET(25 MG) BY MOUTH DAILY [name], MD Taking Active   hydrocortisone (ANUSOL-HC) 2.5 % rectal cream [id number] No Place 1 application rectally 2 (two) times daily.  Patient not taking: Reported on 11/07/2022   [name], MD Not Taking Active   losartan (COZAAR) 50 MG tablet [id number] Yes Take 50 mg by mouth daily. [provider] Taking Active   metFORMIN (GLUCOPHAGE XR) 500 MG 24 hr tablet  [id number] Yes Take 2 tablets (1,000 mg total) by mouth daily with breakfast. [name], MD Taking Active             SDOH Interventions Today    Flowsheet Row Most Recent Value  SDOH Interventions   Financial Strain Interventions Other (Comment)  [discussed savings cards]       Assessment/Plan:   Diabetes: - Currently uncontrolled due to medication cost - Counseled on SGLT2 vs GLP1 benefits. Patient elects to continue to pursue SGLT2 due to renal benefit.  - Discussed available savings cards for Jardiance. Assisted in downloading savings card and sent prescription to pharmacy. Unfortunately, even with copay card, patient's copay was $119 and he cannot afford this. Discussed increasing metformin, patient amenable. Increase to metformin XR 500 mg, two tablets twice daily. Patient counseled on risk of diarrhea; if it occurs and persists for greater than a week, reduce back to 1000 mg daily.   Hypertension: - Currently unknown control.  - Discussed to contact insurance to see if any coverage for BP cuff. Otherwise, discussed purchasing XL cuff - information given via AVS. - Reviewed long term cardiovascular and renal outcomes of uncontrolled blood pressure - Reviewed appropriate blood pressure monitoring technique and reviewed goal blood pressure. Recommended to check home blood pressure and heart rate weekly - Recommend to continue current therapy at this time   Hyperlipidemia/ASCVD Risk Reduction: - Currently controlled.  - Recommend to continue current regimen at this time   Follow Up Plan: phone call in ~ 4 weeks  Catie Hedwig Morton, PharmD, [place], [facility] [phone]

## 2022-11-07 NOTE — Patient Instructions (Signed)
Mr. Liskey,   It was great talking to you today!   Unfortunately, the Vania Rea was too expensive. Consider investigating other plans that offer better coverage for this class of medication, as it does have that protective kidney benefit we discussed.   Increase metformin to 2 tablets twice daily. If you develop diarrhea that persists for more than a week, cut back down to 2 tablets daily.   lease call the customer service number on the back of your insurance card to see if you have any coverage for a home blood pressure machine. If so and if you need a prescription for one, please call us back and let us know where to send that prescription.   If not, please consider purchasing a good quality blood pressure machine, such as an Omron-brand upper arm cuff. This can be purchased from any pharmacy, including our [facility] outpatient pharmacies, for ~$28-$30. We recommend a blood pressure cuff that goes around your upper arm, as these are generally the most accurate.   Call me if you have any questions or concerns.   Thanks!  Catie Hedwig Morton, PharmD, [place], [facility] [phone]

## 2022-12-05 ENCOUNTER — Encounter: Payer: Self-pay | Admitting: Pharmacist

## 2022-12-05 NOTE — Progress Notes (Signed)
Request for diabetic eye exam drafted, printed and faxed to the eye center

## 2022-12-16 ENCOUNTER — Other Ambulatory Visit: Payer: BC Managed Care – PPO | Admitting: Pharmacist

## 2022-12-23 ENCOUNTER — Other Ambulatory Visit: Payer: BC Managed Care – PPO | Admitting: Pharmacist

## 2022-12-25 ENCOUNTER — Telehealth: Payer: Self-pay

## 2022-12-25 NOTE — Telephone Encounter (Signed)
Left voicemail for patient asking him to please call us to schedule the 38-monthfollow-up Dr. ETommi Rumpsrequested at his MRichmondwith him on 10/18/2022.

## 2023-01-16 DIAGNOSIS — B351 Tinea unguium: Secondary | ICD-10-CM | POA: Diagnosis not present

## 2023-01-16 DIAGNOSIS — L600 Ingrowing nail: Secondary | ICD-10-CM | POA: Diagnosis not present

## 2023-01-16 DIAGNOSIS — M79674 Pain in right toe(s): Secondary | ICD-10-CM | POA: Diagnosis not present

## 2023-01-16 DIAGNOSIS — M79675 Pain in left toe(s): Secondary | ICD-10-CM | POA: Diagnosis not present

## 2023-01-28 DIAGNOSIS — I10 Essential (primary) hypertension: Secondary | ICD-10-CM | POA: Diagnosis not present

## 2023-01-28 DIAGNOSIS — R944 Abnormal results of kidney function studies: Secondary | ICD-10-CM | POA: Diagnosis not present

## 2023-01-28 DIAGNOSIS — E119 Type 2 diabetes mellitus without complications: Secondary | ICD-10-CM | POA: Diagnosis not present

## 2023-02-17 DIAGNOSIS — L03031 Cellulitis of right toe: Secondary | ICD-10-CM | POA: Diagnosis not present

## 2023-02-17 DIAGNOSIS — L03032 Cellulitis of left toe: Secondary | ICD-10-CM | POA: Diagnosis not present

## 2023-03-10 DIAGNOSIS — L03031 Cellulitis of right toe: Secondary | ICD-10-CM | POA: Diagnosis not present

## 2023-03-10 DIAGNOSIS — B351 Tinea unguium: Secondary | ICD-10-CM | POA: Diagnosis not present

## 2023-03-10 DIAGNOSIS — L03032 Cellulitis of left toe: Secondary | ICD-10-CM | POA: Diagnosis not present

## 2023-04-01 ENCOUNTER — Other Ambulatory Visit: Payer: Self-pay | Admitting: Family Medicine

## 2023-04-01 DIAGNOSIS — I10 Essential (primary) hypertension: Secondary | ICD-10-CM

## 2023-04-15 ENCOUNTER — Other Ambulatory Visit: Payer: Self-pay | Admitting: Family Medicine

## 2023-04-15 DIAGNOSIS — E785 Hyperlipidemia, unspecified: Secondary | ICD-10-CM

## 2023-05-30 ENCOUNTER — Other Ambulatory Visit: Payer: Self-pay

## 2023-05-30 ENCOUNTER — Telehealth: Payer: Self-pay | Admitting: Family Medicine

## 2023-05-30 MED ORDER — DILTIAZEM HCL ER COATED BEADS 240 MG PO CP24: 240.0000 mg | ORAL_CAPSULE | Freq: Every day | ORAL | 1 refills | Status: DC

## 2023-05-30 NOTE — Telephone Encounter (Signed)
Prescription sent in  

## 2023-05-30 NOTE — Telephone Encounter (Signed)
Prescription Request  05/30/2023  LOV: 08/14/2022  What is the name of the medication or equipment? diltiazem   Have you contacted your pharmacy to request a refill? Yes   Which pharmacy would you like this sent to?  [facility] #[id number] [place], [place] - [address] AT [facility] [address] [place] [address] Phone: [phone] Fax: [phone]    Patient notified that their request is being sent to the clinical staff for review and that they should receive a response within 2 business days.   Please advise at [facility] [phone]   Pt stated there Is a back order for the 240mg  and pt stated maybe the provider can cut the dose and he can take it that way

## 2023-06-16 LAB — HEMOGLOBIN A1C: Hemoglobin A1C: 10.1

## 2023-06-24 ENCOUNTER — Telehealth: Payer: Self-pay | Admitting: Family Medicine

## 2023-06-24 NOTE — Telephone Encounter (Signed)
Pt came into the office to drop a physical examination form for the provider to fill out. Placed in provider folder

## 2023-06-26 NOTE — Telephone Encounter (Signed)
Got Paperwork from up front will work on it so Dr. Birdie Sons can sign it.

## 2023-06-30 DIAGNOSIS — I10 Essential (primary) hypertension: Secondary | ICD-10-CM | POA: Diagnosis not present

## 2023-06-30 DIAGNOSIS — N182 Chronic kidney disease, stage 2 (mild): Secondary | ICD-10-CM | POA: Diagnosis not present

## 2023-06-30 DIAGNOSIS — E1122 Type 2 diabetes mellitus with diabetic chronic kidney disease: Secondary | ICD-10-CM | POA: Diagnosis not present

## 2023-06-30 DIAGNOSIS — R809 Proteinuria, unspecified: Secondary | ICD-10-CM | POA: Diagnosis not present

## 2023-06-30 LAB — MICROALBUMIN, URINE: Microalb, Ur: 12.7

## 2023-06-30 LAB — PROTEIN / CREATININE RATIO, URINE: Creatinine, Urine: 75

## 2023-06-30 LAB — MICROALBUMIN / CREATININE URINE RATIO: Microalb Creat Ratio: 169

## 2023-07-04 ENCOUNTER — Telehealth: Payer: Self-pay | Admitting: Family Medicine

## 2023-07-04 NOTE — Telephone Encounter (Signed)
Paperwork completed per Dr. Birdie Sons

## 2023-07-04 NOTE — Telephone Encounter (Signed)
Lvm for pt to schedule appt and collect paperwork

## 2023-07-04 NOTE — Telephone Encounter (Signed)
Form for physical completed.  Patient has had a physical in the last year.  He does need to be scheduled for follow-up.  Please get him scheduled.  Thanks.

## 2023-07-14 NOTE — Telephone Encounter (Signed)
Spoke to pt. Pt stated he would have his wife pick up paperwork. Also scheduled pt for a f/up for 07/18/23

## 2023-07-18 ENCOUNTER — Ambulatory Visit: Payer: BC Managed Care – PPO | Admitting: Family Medicine

## 2023-07-30 ENCOUNTER — Ambulatory Visit: Payer: BC Managed Care – PPO | Admitting: Family Medicine

## 2023-08-04 ENCOUNTER — Other Ambulatory Visit: Payer: Self-pay | Admitting: Family Medicine

## 2023-08-04 DIAGNOSIS — I10 Essential (primary) hypertension: Secondary | ICD-10-CM

## 2023-08-11 ENCOUNTER — Ambulatory Visit (INDEPENDENT_AMBULATORY_CARE_PROVIDER_SITE_OTHER): Payer: BLUE CROSS/BLUE SHIELD | Admitting: Family Medicine

## 2023-08-11 ENCOUNTER — Encounter: Payer: Self-pay | Admitting: Family Medicine

## 2023-08-11 VITALS — BP 134/80 | HR 88 | Temp 98.6°F | Ht 74.0 in | Wt 321.6 lb

## 2023-08-11 DIAGNOSIS — Z125 Encounter for screening for malignant neoplasm of prostate: Secondary | ICD-10-CM

## 2023-08-11 DIAGNOSIS — Z7984 Long term (current) use of oral hypoglycemic drugs: Secondary | ICD-10-CM

## 2023-08-11 DIAGNOSIS — E119 Type 2 diabetes mellitus without complications: Secondary | ICD-10-CM | POA: Diagnosis not present

## 2023-08-11 DIAGNOSIS — Z7985 Long-term (current) use of injectable non-insulin antidiabetic drugs: Secondary | ICD-10-CM | POA: Diagnosis not present

## 2023-08-11 DIAGNOSIS — I10 Essential (primary) hypertension: Secondary | ICD-10-CM | POA: Diagnosis not present

## 2023-08-11 MED ORDER — LANCETS MISC. MISC
1.0000 | Freq: Every day | 0 refills | Status: DC
Start: 2023-08-11 — End: 2024-03-29

## 2023-08-11 MED ORDER — BLOOD GLUCOSE MONITORING SUPPL DEVI
1.0000 | Freq: Every day | 0 refills | Status: DC
Start: 2023-08-11 — End: 2024-03-29

## 2023-08-11 MED ORDER — LANCET DEVICE MISC
1.0000 | Freq: Every day | 0 refills | Status: DC
Start: 2023-08-11 — End: 2024-03-29

## 2023-08-11 MED ORDER — TIRZEPATIDE 5 MG/0.5ML ~~LOC~~ SOAJ
5.0000 mg | SUBCUTANEOUS | 1 refills | Status: DC
Start: 2023-08-11 — End: 2023-12-17

## 2023-08-11 MED ORDER — TIRZEPATIDE 2.5 MG/0.5ML ~~LOC~~ SOAJ
2.5000 mg | SUBCUTANEOUS | 0 refills | Status: DC
Start: 2023-08-11 — End: 2023-10-09

## 2023-08-11 MED ORDER — BLOOD GLUCOSE TEST VI STRP
1.0000 | ORAL_STRIP | Freq: Every day | 0 refills | Status: AC
Start: 2023-08-11 — End: ?

## 2023-08-11 NOTE — Progress Notes (Addendum)
Eric Alar, MD Phone: [phone]  Eric Stevens is a 52 y.o. male who presents today for f/u.  DIABETES Disease Monitoring: Blood Sugar ranges-not checking, most recent A1c 10.1 in July polyuria/phagia/dipsia- no      Optho- UTD Medications: Compliance- taking metformin, jardiance, glipizide Hypoglycemic symptoms- no  HYPERTENSION Disease Monitoring Home BP Monitoring not checking Chest pain- no    Dyspnea- no Medications Compliance-  taking losartan, hydrochlorothiazide, and diltiazem or amlodipine.  Edema- no BMET    Component Value Date/Time   NA 139 09/17/2022 1426   NA 142 11/20/2014 1840   K 3.9 09/17/2022 1426   K 3.9 11/20/2014 1840   CL 99 09/17/2022 1426   CL 107 11/20/2014 1840   CO2 29 09/17/2022 1426   CO2 31 11/20/2014 1840   GLUCOSE 83 09/17/2022 1426   GLUCOSE 82 11/20/2014 1840   BUN 16 09/17/2022 1426   BUN 14 11/20/2014 1840   CREATININE 1.20 09/17/2022 1426   CREATININE 1.18 10/20/2020 1543   CALCIUM 10.4 09/17/2022 1426   CALCIUM 9.4 11/20/2014 1840   GFRNONAA >60 04/08/2022 0800   GFRNONAA >60 11/20/2014 1840   GFRAA >60 09/22/2016 0959   GFRAA >60 11/20/2014 1840     Social History   Tobacco Use  Smoking Status Former   Types: Cigarettes  Smokeless Tobacco Never    Current Outpatient Medications on File Prior to Visit  Medication Sig Dispense Refill   Accu-Chek Softclix Lancets lancets Use as instructed 100 each 12   amLODipine (NORVASC) 10 MG tablet TAKE 1 TABLET(10 MG) BY MOUTH DAILY 90 tablet 1   atorvastatin (LIPITOR) 40 MG tablet TAKE 1 TABLET(40 MG) BY MOUTH DAILY 90 tablet 1   diltiazem (CARDIZEM CD) 240 MG 24 hr capsule Take 1 capsule (240 mg total) by mouth daily. 90 capsule 1   hydrochlorothiazide (HYDRODIURIL) 25 MG tablet TAKE 1 TABLET(25 MG) BY MOUTH DAILY 90 tablet 1   losartan (COZAAR) 50 MG tablet Take 50 mg by mouth daily.     metFORMIN (GLUCOPHAGE XR) 500 MG 24 hr tablet Take 2 tablets (1,000 mg total) by  mouth 2 (two) times daily with a meal. 360 tablet 1   No current facility-administered medications on file prior to visit.     ROS see history of present illness  Objective  Physical Exam Vitals:   08/11/23 1507 08/11/23 1523  BP: 136/82 134/80  Pulse: 88   Temp: 98.6 F (37 C)   SpO2: 98%     BP Readings from Last 3 Encounters:  08/11/23 134/80  09/17/22 110/80  08/14/22 (!) 140/80   Wt Readings from Last 3 Encounters:  08/11/23 (!) 321 lb 9.6 oz (145.9 kg)  08/14/22 (!) 307 lb 9.6 oz (139.5 kg)  05/01/22 (!) 317 lb 12.8 oz (144.2 kg)    Physical Exam Constitutional:      General: He is not in acute distress.    Appearance: He is not diaphoretic.  Cardiovascular:     Rate and Rhythm: Normal rate and regular rhythm.     Heart sounds: Normal heart sounds.  Pulmonary:     Effort: Pulmonary effort is normal.     Breath sounds: Normal breath sounds.  Skin:    General: Skin is warm and dry.  Neurological:     Mental Status: He is alert.    Diabetic Foot Exam - Simple   Simple Foot Form Diabetic Foot exam was performed with the following findings: Yes 08/11/2023  3:51 PM  Visual Inspection See comments: Yes Sensation Testing Intact to touch and monofilament testing bilaterally: Yes Pulse Check Posterior Tibialis and Dorsalis pulse intact bilaterally: Yes Comments Dry skin on the bottom of his feet, callus bilaterally at first MTP joint       Assessment/Plan: Please see individual problem list.  Type 2 diabetes mellitus without complication, without long-term current use of insulin (HCC) Assessment & Plan: Chronic issue.  Uncontrolled.  Patient will continue metformin 1000 mg twice daily.  He will remain on Jardiance 10 mg daily as long as this is affordable.  We will start him on Mounjaro 2.5 mg weekly for 4 weeks and then 5 mg weekly.  He will be given the website for patient assistance for [facility] and see if he qualifies for this if this is not  affordable.  If he is able to get on the [facility] he will stop the glipizide.  We will start the patient on mounjaro.  They were counseled on the risk of pancreatitis and gallbladder disease.  Discussed the risk of nausea.  They were advised to get evaluated immediately if they develop abdominal pain.  If they develop excessive nausea they will contact us right away.  I discussed that medullary thyroid cancer has been seen in rat studies.  The patient confirmed no personal or family history of thyroid cancer, parathyroid cancer, or adrenal gland cancer.  Discussed that we thus far have not seen medullary thyroid cancer result from use of this type of medication in humans.  They were advised to monitor the thyroid area and contact us for any lumps, swelling, trouble swallowing, or any other changes in this area.    Orders: -     Comprehensive metabolic panel -     Lipid panel -     Tirzepatide; Inject 2.5 mg into the skin once a week for 28 days.  Dispense: 2 mL; Refill: 0 -     Tirzepatide; Inject 5 mg into the skin once a week. Start once you finish the 28 days of the 2.5 mg dose.  Dispense: 6 mL; Refill: 1 -     Blood Glucose Monitoring Suppl; 1 each by Does not apply route daily. May substitute to any manufacturer covered by patient's insurance.  Dispense: 1 each; Refill: 0 -     Blood Glucose Test; 1 each by In Vitro route daily. May substitute to any manufacturer covered by patient's insurance.  Dispense: 100 strip; Refill: 0 -     Lancet Device; 1 each by Does not apply route daily. May substitute to any manufacturer covered by patient's insurance.  Dispense: 1 each; Refill: 0 -     Lancets Misc.; 1 each by Does not apply route daily. May substitute to any manufacturer covered by patient's insurance.  Dispense: 100 each; Refill: 0  Primary hypertension Assessment & Plan: Chronic issue.  Slightly above goal.  Patient will confirm whether or not he is taking amlodipine or diltiazem.  He will  continue HCTZ 25 mg daily and telmisartan 80 mg daily.  Depending on which of the calcium channel blockers he is taking we may need to add additional medication.   Prostate cancer screening -     PSA    Return in about 3 months (around 11/10/2023) for Diabetes/hypertension.   Eric Alar, MD [facility]

## 2023-08-11 NOTE — Addendum Note (Signed)
Addended by: Glori Luis on: 08/11/2023 03:52 PM   Modules accepted: Orders

## 2023-08-11 NOTE — Assessment & Plan Note (Signed)
Chronic issue.  Slightly above goal.  Patient will confirm whether or not he is taking amlodipine or diltiazem.  He will continue HCTZ 25 mg daily and telmisartan 80 mg daily.  Depending on which of the calcium channel blockers he is taking we may need to add additional medication.

## 2023-08-11 NOTE — Patient Instructions (Addendum)
Nice to see you. Please go to the following website to see if you qualify for savings on mounjaro.   [email or web address]  If you are able to get on the [facility] please discontinue the glipizide.  If you are able to stay on the Jardiance that would be preferable as well.  Please let me know if you are taking diltiazem or amlodipine.  You should be on the amlodipine 10 mg daily.

## 2023-08-11 NOTE — Assessment & Plan Note (Addendum)
Chronic issue.  Uncontrolled.  Patient will continue metformin 1000 mg twice daily.  He will remain on Jardiance 10 mg daily as long as this is affordable.  We will start him on Mounjaro 2.5 mg weekly for 4 weeks and then 5 mg weekly.  He will be given the website for patient assistance for [facility] and see if he qualifies for this if this is not affordable.  If he is able to get on the [facility] he will stop the glipizide.  We will start the patient on mounjaro.  They were counseled on the risk of pancreatitis and gallbladder disease.  Discussed the risk of nausea.  They were advised to get evaluated immediately if they develop abdominal pain.  If they develop excessive nausea they will contact us right away.  I discussed that medullary thyroid cancer has been seen in rat studies.  The patient confirmed no personal or family history of thyroid cancer, parathyroid cancer, or adrenal gland cancer.  Discussed that we thus far have not seen medullary thyroid cancer result from use of this type of medication in humans.  They were advised to monitor the thyroid area and contact us for any lumps, swelling, trouble swallowing, or any other changes in this area.

## 2023-08-12 LAB — COMPREHENSIVE METABOLIC PANEL
ALT: 70 U/L — ABNORMAL HIGH (ref 0–53)
AST: 41 U/L — ABNORMAL HIGH (ref 0–37)
Albumin: 4.6 g/dL (ref 3.5–5.2)
Alkaline Phosphatase: 69 U/L (ref 39–117)
BUN: 16 mg/dL (ref 6–23)
CO2: 25 mEq/L (ref 19–32)
Calcium: 10.4 mg/dL (ref 8.4–10.5)
Chloride: 99 mEq/L (ref 96–112)
Creatinine, Ser: 1.16 mg/dL (ref 0.40–1.50)
GFR: 73.28 mL/min (ref >60.00)
Glucose, Bld: 110 mg/dL — ABNORMAL HIGH (ref 70–99)
Potassium: 4 mEq/L (ref 3.5–5.1)
Sodium: 137 mEq/L (ref 135–145)
Total Bilirubin: 0.5 mg/dL (ref 0.2–1.2)
Total Protein: 7.7 g/dL (ref 6.0–8.3)

## 2023-08-12 LAB — LIPID PANEL
Cholesterol: 102 mg/dL (ref 0–200)
HDL: 40.5 mg/dL (ref >39.00)
LDL Cholesterol: 42 mg/dL (ref 0–99)
NonHDL: 61.48
Total CHOL/HDL Ratio: 3
Triglycerides: 99 mg/dL (ref 0.0–149.0)
VLDL: 19.8 mg/dL (ref 0.0–40.0)

## 2023-08-12 LAB — PSA: PSA: 0.54 ng/mL (ref 0.10–4.00)

## 2023-08-14 ENCOUNTER — Encounter: Payer: Self-pay | Admitting: *Deleted

## 2023-08-15 ENCOUNTER — Telehealth: Payer: Self-pay | Admitting: Family Medicine

## 2023-08-15 NOTE — Telephone Encounter (Signed)
Patient called and would like a call back. Dr Birdie Sons put him on tirzepatide [facility]) 2.5 MG/0.5ML Pen . Patient forgot which medication he was suppose to stop taking.

## 2023-08-15 NOTE — Telephone Encounter (Signed)
Phone call to pt.  Per AVS from visit Dr. Birdie Sons wants pt to stop glipizide if he is able to get [facility]

## 2023-08-21 ENCOUNTER — Telehealth: Payer: Self-pay | Admitting: Pharmacist

## 2023-08-21 ENCOUNTER — Other Ambulatory Visit (HOSPITAL_COMMUNITY): Payer: Self-pay

## 2023-08-21 NOTE — Telephone Encounter (Signed)
Pharmacy Patient Advocate Encounter   Received notification from CoverMyMeds that prior authorization for Mounjaro 5MG /0.5ML pen-injectors is required/requested.   Insurance verification completed.   The patient is insured through [employer] [facility] .   Per test claim: PA required; PA submitted to [employer] [facility] via CoverMyMeds Key/confirmation #/EOC [id number] Status is pending

## 2023-08-22 ENCOUNTER — Telehealth: Payer: Self-pay

## 2023-08-22 DIAGNOSIS — K760 Fatty (change of) liver, not elsewhere classified: Secondary | ICD-10-CM

## 2023-08-22 NOTE — Telephone Encounter (Signed)
Left message to call the office back regarding lab results below. 

## 2023-08-22 NOTE — Telephone Encounter (Signed)
Pt called back and I read the note and he would like a GI referral

## 2023-08-22 NOTE — Telephone Encounter (Signed)
-----   Message from [name] sent at 08/13/2023  1:16 PM EDT ----- Please let the patient know that his liver enzymes are mildly elevated.  They are pretty stable over the last year.  It looks like he saw GI in the past for fatty liver disease.  I think it would be worthwhile having him follow-up with them for that issue.  His cholesterol is well-controlled.  His PSA is acceptable.  I can place a GI referral if he would like.

## 2023-08-25 NOTE — Telephone Encounter (Signed)
Referral placed.

## 2023-08-25 NOTE — Addendum Note (Signed)
Addended by: Birdie Sons, [place] Kirchner G on: 08/25/2023 03:50 PM   Modules accepted: Orders

## 2023-09-15 ENCOUNTER — Other Ambulatory Visit (HOSPITAL_COMMUNITY): Payer: Self-pay

## 2023-09-15 IMAGING — CT CT ABD-PEL WO/W CM
2 of 9 series · 12 of 46 positions shown, 14 images · IV contrast (agent unspecified)
Comparison: Abdominal ultrasound September 16, 2013

CLINICAL DATA: Central testicular discomfort times 2-3 weeks,
history of prostatitis. No history of renal stones.

EXAM:
CT ABDOMEN AND PELVIS WITHOUT AND WITH CONTRAST
TECHNIQUE: Multidetector CT imaging of the abdomen and pelvis was performed
following the standard protocol before and following the bolus
administration of intravenous contrast.

[Series 5: abd pelvis pre 2.00 cor · coronal · non-contrast · 0.75mm/px · 3 of 183 slices shown]
[im 46/183  soft-tissue]
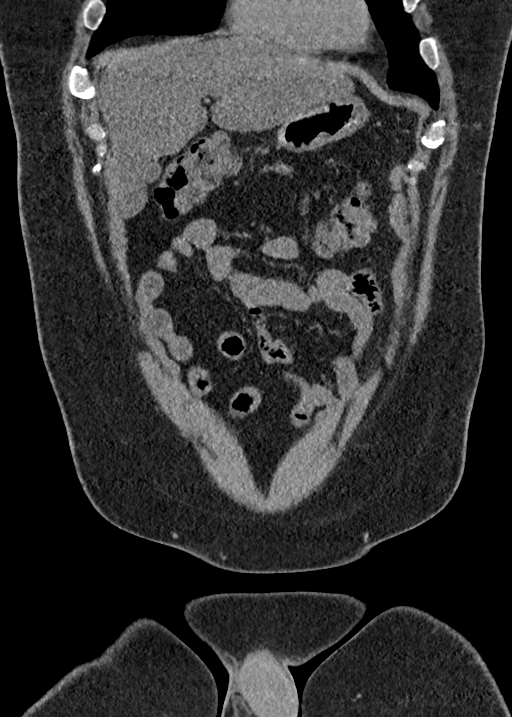
[im 92/183  soft-tissue]
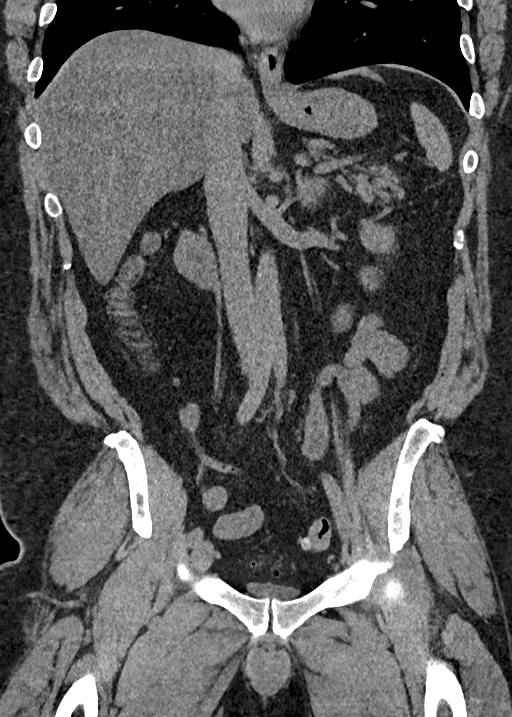
[im 137/183  soft-tissue]
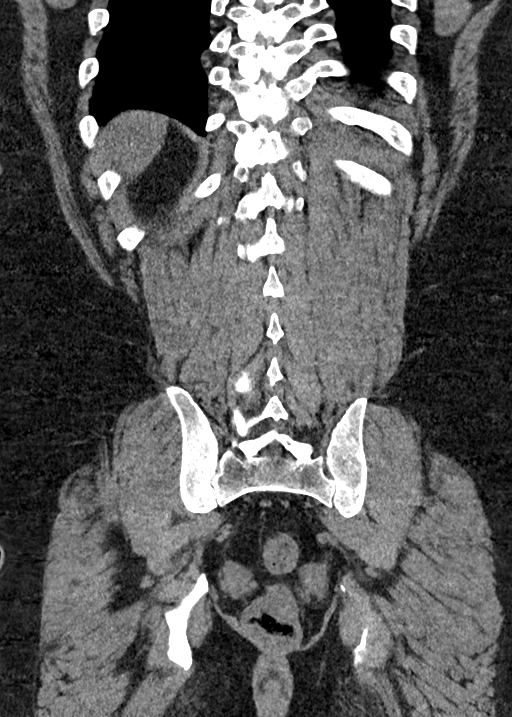

[Series 10: abd pelvis post 5.00 · axial · 0.75mm/px · z∈[-1556,-1131]mm · 9 of 107 slices shown, 11 images]
[im 11/107  soft-tissue]
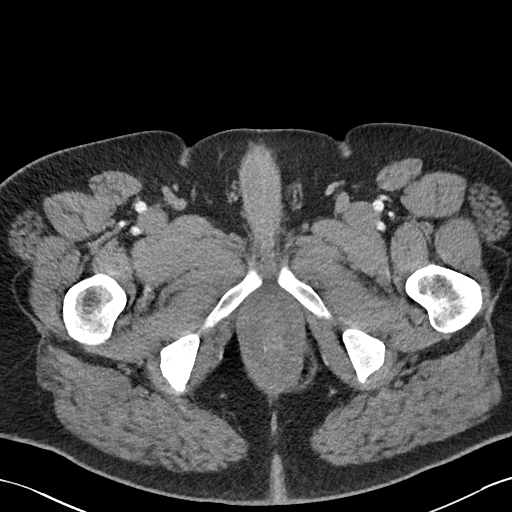
[im 11/107  bone]
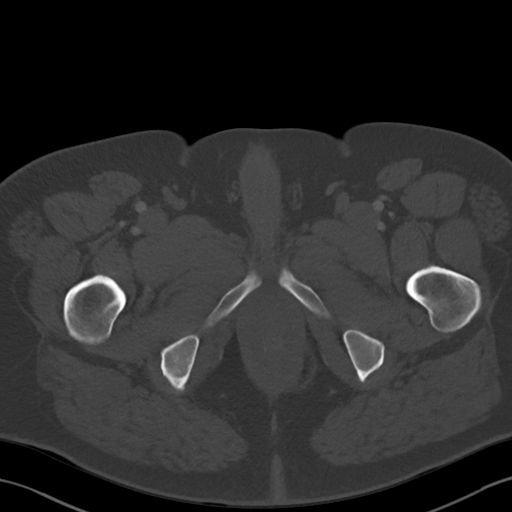
[im 22/107  soft-tissue]
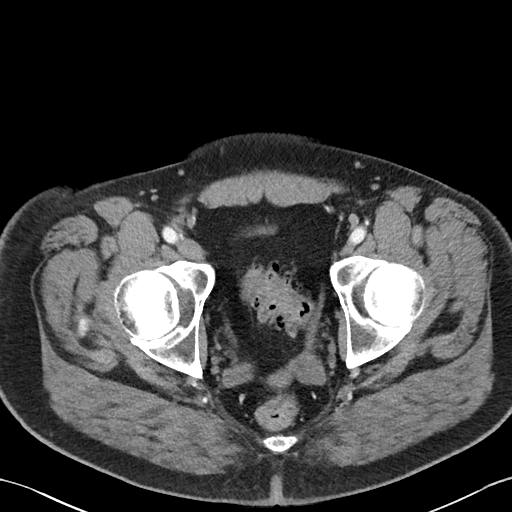
[im 32/107  soft-tissue]
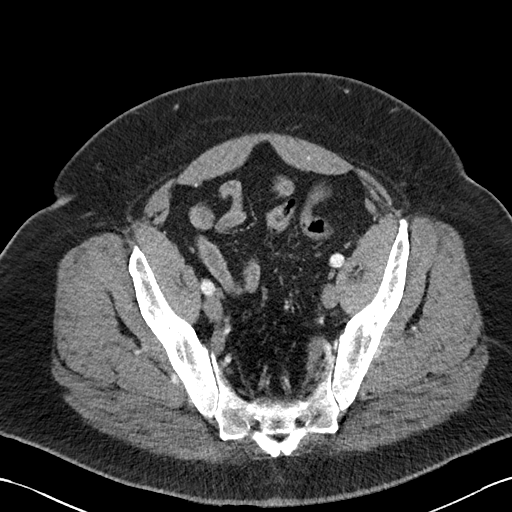
[im 43/107  soft-tissue]
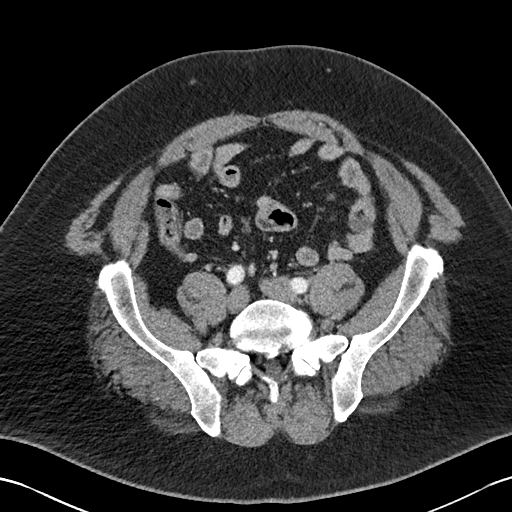
[im 54/107  soft-tissue]
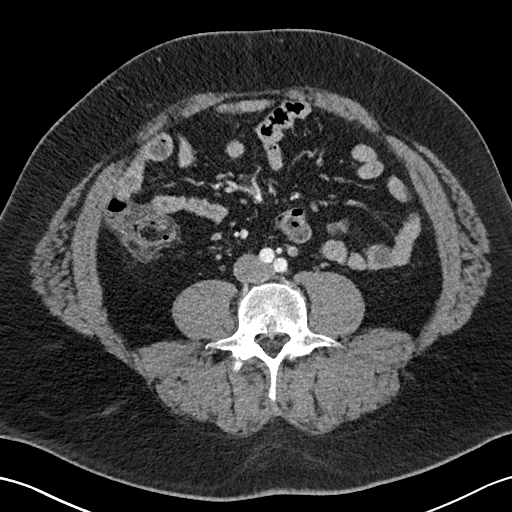
[im 64/107  soft-tissue]
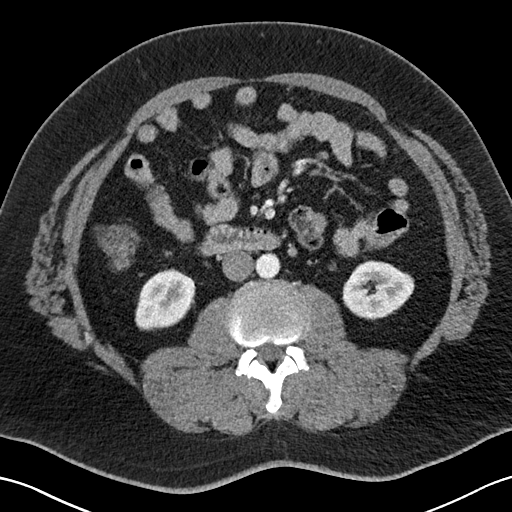
[im 75/107  soft-tissue]
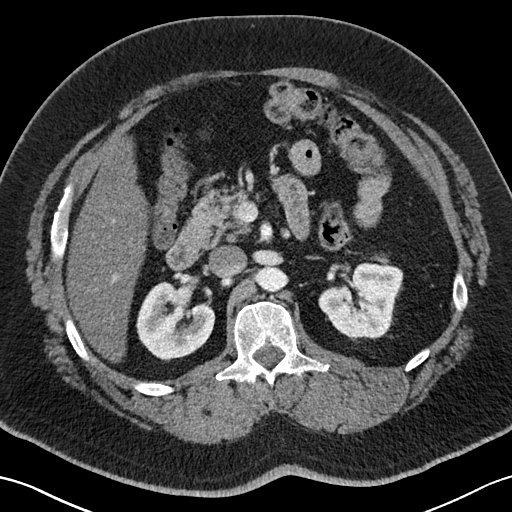
[im 85/107  soft-tissue]
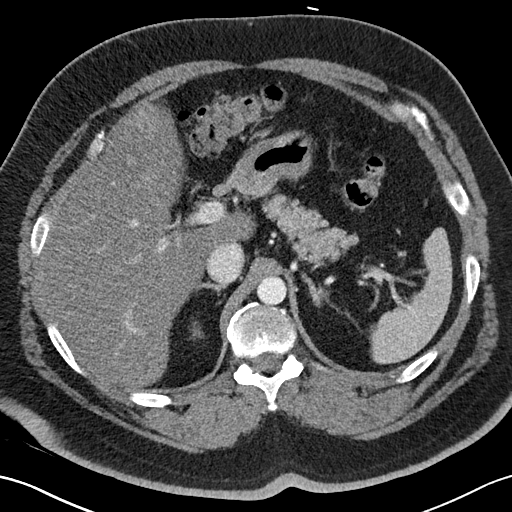
[im 96/107  soft-tissue]
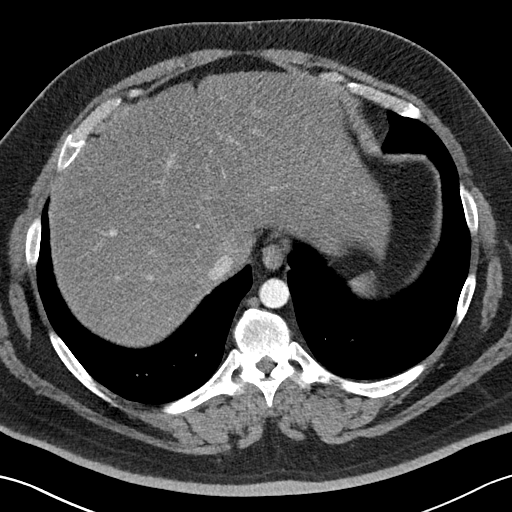
[im 96/107  bone]
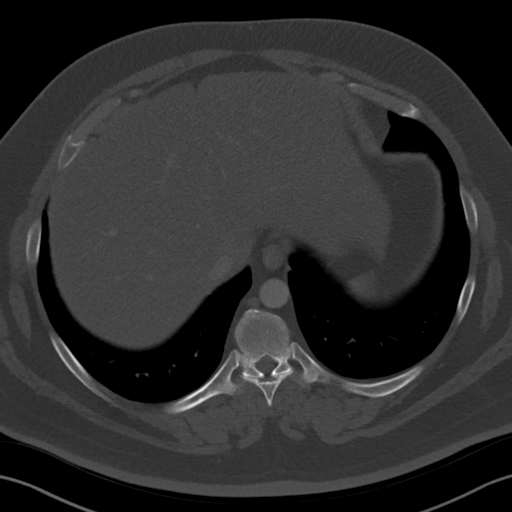

[12 of 46 positions shown; findings below may reference images not displayed]

RADIATION DOSE REDUCTION: This exam was performed according to the
departmental dose-optimization program which includes automated
exposure control, adjustment of the mA and/or kV according to
patient size and/or use of iterative reconstruction technique.

CONTRAST:  100mL OMNIPAQUE IOHEXOL 300 MG/ML  SOLN
FINDINGS: Lower chest: Acute abnormality.

Hepatobiliary: Diffuse hepatic steatosis. Gallbladder is
decompressed. No biliary ductal dilation.

Pancreas: No pancreatic ductal dilation or evidence of acute
inflammation.

Spleen: No splenomegaly or focal splenic lesion.

Adrenals/Urinary Tract: Bilateral adrenal glands appear normal.

No hydronephrosis.  No renal, ureteral or bladder calculi.

No suspicious renal mass.

Kidneys demonstrate symmetric enhancement and excretion of contrast
material.

Urinary bladder is unremarkable for degree of distension.

Stomach/Bowel: No radiopaque enteric contrast was administered.
Stomach is unremarkable for degree of distension. No pathologic
dilation of small or large bowel. The appendix and terminal ileum
appear normal. No evidence of acute bowel inflammation. Left-sided
colonic diverticulosis without findings of acute diverticulitis.
Short segment of sigmoid colonic wall thickening versus
underdistention on image 65/10 in the region of colonic diverticula.

Vascular/Lymphatic: Normal caliber abdominal aorta. No
pathologically enlarged abdominal or pelvic lymph nodes.

Reproductive: Prostate is unremarkable.

Other: Significant abdominopelvic free fluid.

Musculoskeletal: Multilevel degenerative changes spine. No acute
osseous abnormality.
IMPRESSION: 1. No hydronephrosis.  No renal, ureteral or bladder calculi.
2. No acute abnormality in the abdomen or pelvis.
3. Diffuse hepatic steatosis.
4. Left-sided colonic diverticulosis without findings of acute
diverticulitis. Short segment of sigmoid colonic wall thickening
versus underdistention in the region of colonic diverticula common
nonspecific possibly reflecting a combination of underdistention and
Segmental colitis associated with diverticulosis however would
consider further evaluation with colonoscopy to exclude underlying
mass.

## 2023-09-16 ENCOUNTER — Other Ambulatory Visit (HOSPITAL_COMMUNITY): Payer: Self-pay

## 2023-09-19 ENCOUNTER — Telehealth: Payer: Self-pay | Admitting: Family Medicine

## 2023-09-19 NOTE — Telephone Encounter (Signed)
Patient came into the office. He needs to have his A1c checked for work. No labs

## 2023-09-19 NOTE — Telephone Encounter (Signed)
Called pt to get more information before sending to Dr. Birdie Sons.  Pt did not answer. LMTCB

## 2023-09-22 LAB — HEMOGLOBIN A1C: Hemoglobin A1C: 8.7

## 2023-09-24 ENCOUNTER — Telehealth: Payer: Self-pay | Admitting: Family Medicine

## 2023-09-24 NOTE — Telephone Encounter (Signed)
A1c done on 09/22/2023

## 2023-09-24 NOTE — Telephone Encounter (Signed)
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 Birdie Sons is out of the office or is this something that will need to wait till he returns next week. The form is an Insulin treated diabetes assessment.

## 2023-09-24 NOTE — Telephone Encounter (Signed)
Patient dropped off document  Insulin-treated diabetes assessment form , to be filled out by provider. Patient requested to send it via Call Patient to pick up within 5-days. Document is located in providers folder at front office.Please advise at Mobile [phone] (mobile)

## 2023-09-24 NOTE — Telephone Encounter (Signed)
Patient states he is following-up to see if we have been able to complete the form he dropped off for [place].  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  I let patient know that Dr. [name] is out of the office until next week.  I let patient know that I will check with our Doc of the Day (Dr. Duncan Dull) to see if she is willing to sign the form for him.  Patient asked that we please call and let him know either way.

## 2023-09-24 NOTE — Telephone Encounter (Signed)
Filled out office information, and placed in provider box to be signed.

## 2023-09-25 NOTE — Telephone Encounter (Signed)
I spoke with Sandy Salaam, CMA, and she states she will call patient back.  I relayed message to patient.  Patient states he would like for Eric Stevens to please call him at [phone].  Patient states he may have written an incorrect number on his for his phone number.  Patient states he thinks he wrote down [phone] instead of [phone].

## 2023-09-25 NOTE — Telephone Encounter (Signed)
Eric Manis do you have this form?

## 2023-09-25 NOTE — Telephone Encounter (Signed)
Spoke with [facility] to find out if this form is even necessary because the pt is not on insulin only on Mounjaro. 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 I spoke with pt to let him know that Dr. Birdie Sons will be back in the office on Monday and that we will call him when it is ready. Pt stated that he is unable to work until this is completed and he can return to [facility]. Form has been placed in with Dr. Purvis Sheffield paperwork.

## 2023-09-29 NOTE — Telephone Encounter (Signed)
Patient called about getting his paper work picked up. Patient was advised that office will call when ready.

## 2023-09-29 NOTE — Telephone Encounter (Signed)
Clydie Braun at front desk came back here and picked up the paperwork to give to the Patient.

## 2023-09-29 NOTE — Telephone Encounter (Signed)
Patient is on his way to pick up paper work.

## 2023-09-29 NOTE — Telephone Encounter (Signed)
Completed.  Please make available for pickup. 

## 2023-10-08 ENCOUNTER — Encounter: Payer: Self-pay | Admitting: Gastroenterology

## 2023-10-09 ENCOUNTER — Other Ambulatory Visit: Payer: Self-pay | Admitting: Family Medicine

## 2023-10-09 DIAGNOSIS — E119 Type 2 diabetes mellitus without complications: Secondary | ICD-10-CM

## 2023-10-21 ENCOUNTER — Other Ambulatory Visit (HOSPITAL_COMMUNITY): Payer: Self-pay

## 2023-11-04 ENCOUNTER — Other Ambulatory Visit (HOSPITAL_COMMUNITY): Payer: Self-pay

## 2023-11-04 NOTE — Telephone Encounter (Signed)
We received a kickback from covermymeds saying "Outcome N/A. Your PA request cannot be processed electronically. For further inquiries please contact the number on the back of the member prescription card." I called rx benefits at [phone] and they said we had to submit the prior auth through [email or web address]. I submitted the pa, [id number]. They said we should be able to check on the pa on the website using pt id of [id number] and that EOC number after at least 24 hours. Their fax number is [phone] if we need to fax any further info to them.

## 2023-11-04 NOTE — Telephone Encounter (Signed)
Pt has already picked up medication

## 2023-11-04 NOTE — Telephone Encounter (Signed)
Pharmacy Patient Advocate Encounter  Received notification from [facility] that Prior Authorization for [facility] 5MG /0.5ML pen-injectors has been APPROVED from 11/04/2023 to 11/02/2024. Ran test claim, Copay is $35.00 for a one month supply. This test claim was processed through [facility]- copay amounts may vary at other pharmacies due to pharmacy/plan contracts, or as the patient moves through the different stages of their insurance plan.   PA #/Case ID/Reference #: [id number]

## 2023-11-17 ENCOUNTER — Ambulatory Visit: Payer: BLUE CROSS/BLUE SHIELD | Admitting: Family Medicine

## 2023-11-24 LAB — HM DIABETES EYE EXAM

## 2023-11-26 ENCOUNTER — Encounter: Payer: Self-pay | Admitting: Family Medicine

## 2023-11-26 DIAGNOSIS — E11319 Type 2 diabetes mellitus with unspecified diabetic retinopathy without macular edema: Secondary | ICD-10-CM | POA: Insufficient documentation

## 2023-12-01 ENCOUNTER — Other Ambulatory Visit: Payer: Self-pay | Admitting: Family Medicine

## 2023-12-01 DIAGNOSIS — E119 Type 2 diabetes mellitus without complications: Secondary | ICD-10-CM

## 2023-12-02 NOTE — Telephone Encounter (Signed)
 Please call the patient and see if he has been taking the Jardiance?  Per my last note on his diabetes he was supposed to remain on this as long as it was affordable.

## 2023-12-04 ENCOUNTER — Telehealth: Payer: Self-pay

## 2023-12-04 NOTE — Progress Notes (Signed)
Care Guide Pharmacy Note  12/04/2023 Name: Eric Stevens MRN: [id number] DOB: 1971-09-13  Referred By: Glori Luis, MD Reason for referral: Care Coordination (TNM Diabetes. )   Eric Stevens is a 53 y.o. year old male who is a primary care patient of Birdie Sons, Yehuda Mao, MD.  Eric Stevens was referred to the pharmacist for assistance related to: DMII  An unsuccessful telephone outreach was attempted today to contact the patient who was referred to the pharmacy team for assistance with diabetes. Additional attempts will be made to contact the patient.  [name]  [facility], [facility] Health Care Management Assistant Direct Dial: [phone]  Fax: [phone]

## 2023-12-09 ENCOUNTER — Telehealth: Payer: Self-pay

## 2023-12-09 NOTE — Progress Notes (Signed)
Care Guide Pharmacy Note  12/09/2023 Name: Eric Stevens MRN: [id number] DOB: January 20, 1971  Referred By: Glori Luis, MD Reason for referral: Care Coordination (TNM Diabetes. )   Eric Stevens is a 53 y.o. year old male who is a primary care patient of Birdie Sons, Yehuda Mao, MD.  [name] was referred to the pharmacist for assistance related to: DMII  A second unsuccessful telephone outreach was attempted today to contact the patient who was referred to the pharmacy team for assistance with diabetes. Additional attempts will be made to contact the patient.  [name]  [facility], [facility] Health Care Management Assistant Direct Dial: [phone]  Fax: [phone]

## 2023-12-11 ENCOUNTER — Telehealth: Payer: Self-pay

## 2023-12-11 NOTE — Progress Notes (Signed)
Care Guide Pharmacy Note  12/11/2023 Name: Eric Stevens MRN: [id number] DOB: 1970-11-26  Referred By: Glori Luis, MD Reason for referral: Care Coordination (TNM Diabetes. )   Eric Stevens is a 53 y.o. year old male who is a primary care patient of Birdie Sons, Yehuda Mao, MD.  [name] was referred to the pharmacist for assistance related to: DMII  Successful contact was made with the patient to discuss pharmacy services.  Patient declines engagement at this time. Contact information was provided to the patient should they wish to reach out for assistance at a later time.  [name]  [facility], [facility] Health Care Management Assistant Direct Dial: [phone]  Fax: [phone]

## 2023-12-17 ENCOUNTER — Encounter: Payer: Self-pay | Admitting: Family Medicine

## 2023-12-17 ENCOUNTER — Ambulatory Visit: Payer: BLUE CROSS/BLUE SHIELD | Admitting: Family Medicine

## 2023-12-17 VITALS — BP 128/84 | HR 96 | Temp 98.2°F | Resp 18 | Ht 74.0 in | Wt 304.4 lb

## 2023-12-17 DIAGNOSIS — E11319 Type 2 diabetes mellitus with unspecified diabetic retinopathy without macular edema: Secondary | ICD-10-CM

## 2023-12-17 DIAGNOSIS — Z7985 Long-term (current) use of injectable non-insulin antidiabetic drugs: Secondary | ICD-10-CM | POA: Diagnosis not present

## 2023-12-17 DIAGNOSIS — I10 Essential (primary) hypertension: Secondary | ICD-10-CM | POA: Diagnosis not present

## 2023-12-17 DIAGNOSIS — E119 Type 2 diabetes mellitus without complications: Secondary | ICD-10-CM

## 2023-12-17 MED ORDER — SEMAGLUTIDE(0.25 OR 0.5MG/DOS) 2 MG/3ML ~~LOC~~ SOPN
PEN_INJECTOR | SUBCUTANEOUS | 2 refills | Status: DC
Start: 2023-12-17 — End: 2024-03-29

## 2023-12-17 NOTE — Patient Instructions (Signed)
Nice to see you. Please stop the [facility].  When you are supposed to take your next dose of Mounjaro please start taking the Ozempic.  If you have any side effects with this please let us know.

## 2023-12-17 NOTE — Assessment & Plan Note (Signed)
Recently found through his optometrist.  Advised adequate glucose control to help prevent this from worsening and hopefully help it resolve.

## 2023-12-17 NOTE — Assessment & Plan Note (Signed)
Chronic issue.  Generally well-controlled.  Patient will continue 10 mg daily, diltiazem 240 mg daily, HCTZ 25 mg daily, and losartan 50 mg daily.

## 2023-12-17 NOTE — Assessment & Plan Note (Signed)
Chronic issue.  We we will switch to Ozempic 0.25 mg weekly for 4 weeks and then increase to Ozempic 0.5 mg weekly.  If he has GI side effects with this he will let us know.  He will continue Jardiance 10 mg daily.

## 2023-12-17 NOTE — Progress Notes (Signed)
[name], MD Phone: [phone]  Eric Stevens is a 53 y.o. male who presents today for f/u.  HYPERTENSION Disease Monitoring Home BP Monitoring not checking Chest pain- no    Dyspnea- no Medications Compliance-  taking amlodipine, diltiazem, hydrochlorothiazide, telmisartan.  Edema- no BMET    Component Value Date/Time   NA 137 08/11/2023 1529   NA 142 11/20/2014 1840   K 4.0 08/11/2023 1529   K 3.9 11/20/2014 1840   CL 99 08/11/2023 1529   CL 107 11/20/2014 1840   CO2 25 08/11/2023 1529   CO2 31 11/20/2014 1840   GLUCOSE 110 (H) 08/11/2023 1529   GLUCOSE 82 11/20/2014 1840   BUN 16 08/11/2023 1529   BUN 14 11/20/2014 1840   CREATININE 1.16 08/11/2023 1529   CREATININE 1.18 10/20/2020 1543   CALCIUM 10.4 08/11/2023 1529   CALCIUM 9.4 11/20/2014 1840   GFRNONAA >60 04/08/2022 0800   GFRNONAA >60 11/20/2014 1840   GFRAA >60 09/22/2016 0959   GFRAA >60 11/20/2014 1840   DIABETES Disease Monitoring: Blood Sugar ranges-not checking Polyuria/phagia/dipsia- polyuria that is chronic Medications: Compliance- taking jardiance and mounjaro though notes GI issues with the mounjaro Hypoglycemic symptoms- no   Social History   Tobacco Use  Smoking Status Former   Types: Cigarettes  Smokeless Tobacco Never    Current Outpatient Medications on File Prior to Visit  Medication Sig Dispense Refill   Accu-Chek Softclix Lancets lancets Use as instructed 100 each 12   amLODipine (NORVASC) 10 MG tablet TAKE 1 TABLET(10 MG) BY MOUTH DAILY 90 tablet 1   atorvastatin (LIPITOR) 40 MG tablet TAKE 1 TABLET(40 MG) BY MOUTH DAILY 90 tablet 1   Blood Glucose Monitoring Suppl DEVI 1 each by Does not apply route daily. May substitute to any manufacturer covered by patient's insurance. 1 each 0   diltiazem (CARDIZEM CD) 240 MG 24 hr capsule Take 1 capsule (240 mg total) by mouth daily. 90 capsule 1   Glucose Blood (BLOOD GLUCOSE TEST STRIPS) STRP 1 each by In Vitro route daily. May  substitute to any manufacturer covered by patient's insurance. 100 strip 0   hydrochlorothiazide (HYDRODIURIL) 25 MG tablet TAKE 1 TABLET(25 MG) BY MOUTH DAILY 90 tablet 1   JARDIANCE 10 MG TABS tablet TAKE 1 TABLET(10 MG) BY MOUTH DAILY BEFORE BREAKFAST 30 tablet 2   Lancet Device MISC 1 each by Does not apply route daily. May substitute to any manufacturer covered by patient's insurance. 1 each 0   Lancets Misc. MISC 1 each by Does not apply route daily. May substitute to any manufacturer covered by patient's insurance. 100 each 0   losartan (COZAAR) 50 MG tablet Take 50 mg by mouth daily.     metFORMIN (GLUCOPHAGE XR) 500 MG 24 hr tablet Take 2 tablets (1,000 mg total) by mouth 2 (two) times daily with a meal. 360 tablet 1   No current facility-administered medications on file prior to visit.     ROS see history of present illness  Objective  Physical Exam Vitals:   12/17/23 1435  BP: 128/84  Pulse: 96  Resp: 18  Temp: 98.2 F (36.8 C)  SpO2: 97%    BP Readings from Last 3 Encounters:  12/17/23 128/84  08/11/23 134/80  09/17/22 110/80   Wt Readings from Last 3 Encounters:  12/17/23 (!) 304 lb 6 oz (138.1 kg)  08/11/23 (!) 321 lb 9.6 oz (145.9 kg)  08/14/22 (!) 307 lb 9.6 oz (139.5 kg)    Physical  Exam Constitutional:      General: He is not in acute distress.    Appearance: He is not diaphoretic.  Cardiovascular:     Rate and Rhythm: Normal rate and regular rhythm.     Heart sounds: Normal heart sounds.  Pulmonary:     Effort: Pulmonary effort is normal.     Breath sounds: Normal breath sounds.  Skin:    General: Skin is warm and dry.  Neurological:     Mental Status: He is alert.      Assessment/Plan: Please see individual problem list.  Type 2 diabetes mellitus without complication, without long-term current use of insulin (HCC) Assessment & Plan: Chronic issue.  We we will switch to Ozempic 0.25 mg weekly for 4 weeks and then increase to Ozempic 0.5  mg weekly.  If he has GI side effects with this he will let us know.  He will continue Jardiance 10 mg daily.  Orders: -     Semaglutide(0.25 or 0.5MG /DOS); Inject 0.25 mg into the skin once a week for 28 days, THEN 0.5 mg once a week.  Dispense: 3 mL; Refill: 2 -     Hemoglobin A1c; Future -     Comprehensive metabolic panel; Future  Primary hypertension Assessment & Plan: Chronic issue.  Generally well-controlled.  Patient will continue 10 mg daily, diltiazem 240 mg daily, HCTZ 25 mg daily, and losartan 50 mg daily.   Diabetic retinopathy associated with type 2 diabetes mellitus, macular edema presence unspecified, unspecified laterality, unspecified retinopathy severity (HCC) Assessment & Plan: Recently found through his optometrist.  Advised adequate glucose control to help prevent this from worsening and hopefully help it resolve.      Return in about 1 week (around 12/24/2023) for Labs on or after this date, 3 months transfer of care.   [name], MD [facility]

## 2023-12-18 ENCOUNTER — Other Ambulatory Visit (HOSPITAL_COMMUNITY): Payer: Self-pay

## 2023-12-31 ENCOUNTER — Other Ambulatory Visit: Payer: BLUE CROSS/BLUE SHIELD

## 2024-01-07 ENCOUNTER — Other Ambulatory Visit: Payer: BLUE CROSS/BLUE SHIELD

## 2024-01-27 ENCOUNTER — Encounter: Payer: Self-pay | Admitting: Nurse Practitioner

## 2024-01-27 ENCOUNTER — Other Ambulatory Visit: Payer: BLUE CROSS/BLUE SHIELD

## 2024-01-27 DIAGNOSIS — E119 Type 2 diabetes mellitus without complications: Secondary | ICD-10-CM

## 2024-01-27 LAB — COMPREHENSIVE METABOLIC PANEL
ALT: 50 U/L (ref 0–53)
AST: 33 U/L (ref 0–37)
Albumin: 4.4 g/dL (ref 3.5–5.2)
Alkaline Phosphatase: 60 U/L (ref 39–117)
BUN: 14 mg/dL (ref 6–23)
CO2: 24 meq/L (ref 19–32)
Calcium: 9.6 mg/dL (ref 8.4–10.5)
Chloride: 105 meq/L (ref 96–112)
Creatinine, Ser: 1.21 mg/dL (ref 0.40–1.50)
GFR: 69.44 mL/min (ref >60.00)
Glucose, Bld: 124 mg/dL — ABNORMAL HIGH (ref 70–99)
Potassium: 3.6 meq/L (ref 3.5–5.1)
Sodium: 138 meq/L (ref 135–145)
Total Bilirubin: 0.7 mg/dL (ref 0.2–1.2)
Total Protein: 7.6 g/dL (ref 6.0–8.3)

## 2024-01-27 LAB — HEMOGLOBIN A1C: Hgb A1c MFr Bld: 6.9 % — ABNORMAL HIGH (ref 4.6–6.5)

## 2024-02-05 ENCOUNTER — Telehealth: Payer: Self-pay | Admitting: Family Medicine

## 2024-02-05 NOTE — Telephone Encounter (Signed)
 Your transfer of care appointment with Eric Stevens needed to be changed to 03/26/2024 @ 10am. If this not a good time please call the office to reschedule appointment. E2C2 if patient calls please reschedule to next [facility], could be out to July or longer.

## 2024-02-09 ENCOUNTER — Other Ambulatory Visit: Payer: Self-pay

## 2024-02-09 DIAGNOSIS — E119 Type 2 diabetes mellitus without complications: Secondary | ICD-10-CM

## 2024-02-09 MED ORDER — METFORMIN HCL ER 500 MG PO TB24: 1000.0000 mg | ORAL_TABLET | Freq: Two times a day (BID) | ORAL | 0 refills | Status: DC

## 2024-03-16 ENCOUNTER — Ambulatory Visit: Payer: BLUE CROSS/BLUE SHIELD | Admitting: Nurse Practitioner

## 2024-03-23 ENCOUNTER — Telehealth: Payer: Self-pay

## 2024-03-23 NOTE — Telephone Encounter (Signed)
 I left a voicemail for patient asking him to please call us .   E2C2 - when patient calls back, please let him know that his appointment with us  on 03/26/2024 is for a 10-month follow-up and his transfer of care appointment will need to be scheduled separately.  Please assist patient in scheduling his transfer of care appointment when he calls back.  I also sent a message to patient via MyChart.

## 2024-03-26 ENCOUNTER — Ambulatory Visit: Admitting: Nurse Practitioner

## 2024-03-29 ENCOUNTER — Ambulatory Visit: Admitting: Nurse Practitioner

## 2024-03-29 ENCOUNTER — Encounter: Payer: Self-pay | Admitting: Nurse Practitioner

## 2024-03-29 VITALS — BP 132/84 | HR 94 | Temp 98.4°F | Ht 74.0 in | Wt 304.0 lb

## 2024-03-29 DIAGNOSIS — Z7985 Long-term (current) use of injectable non-insulin antidiabetic drugs: Secondary | ICD-10-CM

## 2024-03-29 DIAGNOSIS — E785 Hyperlipidemia, unspecified: Secondary | ICD-10-CM | POA: Diagnosis not present

## 2024-03-29 DIAGNOSIS — I10 Essential (primary) hypertension: Secondary | ICD-10-CM

## 2024-03-29 DIAGNOSIS — E119 Type 2 diabetes mellitus without complications: Secondary | ICD-10-CM | POA: Diagnosis not present

## 2024-03-29 DIAGNOSIS — Z7984 Long term (current) use of oral hypoglycemic drugs: Secondary | ICD-10-CM | POA: Diagnosis not present

## 2024-03-29 MED ORDER — HYDROCHLOROTHIAZIDE 25 MG PO TABS: 12.5000 mg | ORAL_TABLET | Freq: Every day | ORAL | 1 refills | Status: DC

## 2024-03-29 MED ORDER — SEMAGLUTIDE(0.25 OR 0.5MG/DOS) 2 MG/3ML ~~LOC~~ SOPN: 0.5000 mg | PEN_INJECTOR | SUBCUTANEOUS | 2 refills | Status: AC

## 2024-03-29 NOTE — Assessment & Plan Note (Signed)
 Type 2 Diabetes is controlled with an A1c of 6.9, though there has been inconsistent use of Ozempic , missing two weeks. The goal is an A1c under 7, ideally below 6.9. Restart Ozempic  at 0.25 mg then increase to 0.5 mg. Send a refill for Ozempic . Recheck A1c in three months. Continue Jardiance  and metformin  as prescribed. Encourage healthy diet and regular exercise.

## 2024-03-29 NOTE — Progress Notes (Signed)
 Eric Burkitt, NP-C Phone: [phone]  Eric Stevens is a 53 y.o. male who presents today for transfer of care.   Discussed the use of AI scribe software for clinical note transcription with the patient, who gave verbal consent to proceed.  History of Present Illness   Eric Stevens is a 53 year old male with hypertension and type 2 diabetes who presents for transfer of care.  He has not been taking hydrochlorothiazide  for a few weeks due to a refill issue. His current medications include amlodipine  10 mg, losartan  50 mg, diltiazem , metformin  two tablets twice daily, Jardiance , Lipitor, and Ozempic .   Regarding hypertension, he has not been monitoring his blood pressure at home but confirms regular intake of amlodipine , diltiazem  and losartan . No chest pain, shortness of breath, dizziness, or swelling.  For type 2 diabetes, he takes metformin  and Jardiance  but has not been checking blood sugar levels regularly. No excessive thirst or low blood sugar episodes. He reports frequent urination but is unsure if it is medication-related. Previously on Mounjaro , he switched to Ozempic  due to gastrointestinal side effects. He has been inconsistent with Ozempic  injections, missing about two weeks, but resumed with a 0.25 mg dose today. His last A1c was 6.9% in March.      Social History   Tobacco Use  Smoking Status Former   Types: Cigarettes  Smokeless Tobacco Never    Current Outpatient Medications on File Prior to Visit  Medication Sig Dispense Refill   Accu-Chek Softclix Lancets lancets Use as instructed 100 each 12   amLODipine  (NORVASC ) 10 MG tablet TAKE 1 TABLET(10 MG) BY MOUTH DAILY 90 tablet 1   atorvastatin  (LIPITOR) 40 MG tablet TAKE 1 TABLET(40 MG) BY MOUTH DAILY 90 tablet 1   diltiazem  (CARDIZEM  CD) 240 MG 24 hr capsule Take 1 capsule (240 mg total) by mouth daily. 90 capsule 1   Glucose Blood (BLOOD GLUCOSE TEST STRIPS) STRP 1 each by In Vitro route daily. May substitute to  any manufacturer covered by patient's insurance. 100 strip 0   JARDIANCE  10 MG TABS tablet TAKE 1 TABLET(10 MG) BY MOUTH DAILY BEFORE BREAKFAST 30 tablet 2   losartan  (COZAAR ) 50 MG tablet Take 50 mg by mouth daily.     metFORMIN  (GLUCOPHAGE  XR) 500 MG 24 hr tablet Take 2 tablets (1,000 mg total) by mouth 2 (two) times daily with a meal. 360 tablet 0   No current facility-administered medications on file prior to visit.     ROS see history of present illness  Objective  Physical Exam Vitals:   03/29/24 1025  BP: 132/84  Pulse: 94  Temp: 98.4 F (36.9 C)  SpO2: 94%    BP Readings from Last 3 Encounters:  03/29/24 132/84  12/17/23 128/84  08/11/23 134/80   Wt Readings from Last 3 Encounters:  03/29/24 (!) 304 lb (137.9 kg)  12/17/23 (!) 304 lb 6 oz (138.1 kg)  08/11/23 (!) 321 lb 9.6 oz (145.9 kg)    Physical Exam Constitutional:      General: He is not in acute distress.    Appearance: Normal appearance.  HENT:     Head: Normocephalic.  Cardiovascular:     Rate and Rhythm: Normal rate and regular rhythm.     Heart sounds: Normal heart sounds.  Pulmonary:     Effort: Pulmonary effort is normal.     Breath sounds: Normal breath sounds.  Skin:    General: Skin is warm and dry.  Neurological:  General: No focal deficit present.     Mental Status: He is alert.  Psychiatric:        Mood and Affect: Mood normal.        Behavior: Behavior normal.     Assessment/Plan: Please see individual problem list.  Type 2 diabetes mellitus without complication, without long-term current use of insulin (HCC) Assessment & Plan: Type 2 Diabetes is controlled with an A1c of 6.9, though there has been inconsistent use of Ozempic , missing two weeks. The goal is an A1c under 7, ideally below 6.9. Restart Ozempic  at 0.25 mg then increase to 0.5 mg. Send a refill for Ozempic . Recheck A1c in three months. Continue Jardiance  and metformin  as prescribed. Encourage healthy diet and  regular exercise.   Orders: -     Semaglutide (0.25 or 0.5MG /DOS); Inject 0.5 mg into the skin once a week.  Dispense: 3 mL; Refill: 2  Primary hypertension Assessment & Plan: Hypertension is not at target due to a lapse in hydrochlorothiazide . Blood pressure is decent but not optimal. Restart hydrochlorothiazide  at half a tablet daily. Continue Norvasc , Losartan  and Diltiazem  daily. Schedule a two-week follow-up for a blood pressure check with the nurse and check BMP. Refill sent.   Orders: -     Basic metabolic panel with GFR; Future -     hydroCHLOROthiazide ; Take 0.5 tablets (12.5 mg total) by mouth daily.  Dispense: 90 tablet; Refill: 1  Hyperlipidemia, unspecified hyperlipidemia type Assessment & Plan: Cholesterol is managed with lipitor 40 mg daily. Continue.       Return in about 2 weeks (around 04/12/2024) for Blood pressure check with nursing then 3 month follow up.   Eric Burkitt, NP-C [facility]

## 2024-03-29 NOTE — Assessment & Plan Note (Signed)
 Cholesterol is managed with lipitor 40 mg daily. Continue.

## 2024-03-29 NOTE — Assessment & Plan Note (Addendum)
 Hypertension is not at target due to a lapse in hydrochlorothiazide . Blood pressure is decent but not optimal. Restart hydrochlorothiazide  at half a tablet daily. Continue Norvasc , Losartan  and Diltiazem  daily. Schedule a two-week follow-up for a blood pressure check with the nurse and check BMP. Refill sent.

## 2024-04-14 ENCOUNTER — Ambulatory Visit

## 2024-04-14 ENCOUNTER — Ambulatory Visit (INDEPENDENT_AMBULATORY_CARE_PROVIDER_SITE_OTHER)

## 2024-04-14 DIAGNOSIS — I10 Essential (primary) hypertension: Secondary | ICD-10-CM

## 2024-04-14 NOTE — Progress Notes (Signed)
 Patient here for nurse visit BP check per order from Bluford Burkitt NP.   Patient reports compliance with prescribed BP medications: yes  Pt has no complaints of any chest pain, headaches or dizziness. Pt stated that he's been taking his medications as prescribed.   BP Readings from Last 3 Encounters:  04/14/24 120/80  03/29/24 132/84  12/17/23 128/84   Pulse Readings from Last 3 Encounters:  03/29/24 94  12/17/23 96  08/11/23 88    Jozey Janco, CMA

## 2024-04-15 ENCOUNTER — Ambulatory Visit: Payer: Self-pay | Admitting: Nurse Practitioner

## 2024-04-15 LAB — BASIC METABOLIC PANEL WITH GFR
BUN: 19 mg/dL (ref 6–23)
CO2: 28 meq/L (ref 19–32)
Calcium: 10.3 mg/dL (ref 8.4–10.5)
Chloride: 99 meq/L (ref 96–112)
Creatinine, Ser: 1.16 mg/dL (ref 0.40–1.50)
GFR: 72.94 mL/min (ref >60.00)
Glucose, Bld: 148 mg/dL — ABNORMAL HIGH (ref 70–99)
Potassium: 4.1 meq/L (ref 3.5–5.1)
Sodium: 137 meq/L (ref 135–145)

## 2024-05-11 ENCOUNTER — Other Ambulatory Visit: Payer: Self-pay

## 2024-05-11 DIAGNOSIS — I10 Essential (primary) hypertension: Secondary | ICD-10-CM

## 2024-05-11 DIAGNOSIS — E119 Type 2 diabetes mellitus without complications: Secondary | ICD-10-CM

## 2024-05-11 DIAGNOSIS — E785 Hyperlipidemia, unspecified: Secondary | ICD-10-CM

## 2024-05-11 MED ORDER — ATORVASTATIN CALCIUM 40 MG PO TABS
40.0000 mg | ORAL_TABLET | Freq: Every day | ORAL | 3 refills | Status: DC
Start: 2024-05-11 — End: 2024-07-13

## 2024-05-11 MED ORDER — AMLODIPINE BESYLATE 10 MG PO TABS: 10.0000 mg | ORAL_TABLET | Freq: Every day | ORAL | 3 refills | Status: DC

## 2024-05-11 MED ORDER — METFORMIN HCL ER 500 MG PO TB24
1000.0000 mg | ORAL_TABLET | Freq: Two times a day (BID) | ORAL | 3 refills | Status: DC
Start: 2024-05-11 — End: 2024-07-13

## 2024-05-28 ENCOUNTER — Other Ambulatory Visit: Payer: Self-pay

## 2024-05-28 DIAGNOSIS — E119 Type 2 diabetes mellitus without complications: Secondary | ICD-10-CM

## 2024-05-28 MED ORDER — EMPAGLIFLOZIN 10 MG PO TABS: 10.0000 mg | ORAL_TABLET | Freq: Every day | ORAL | 3 refills | Status: DC

## 2024-07-02 ENCOUNTER — Ambulatory Visit: Admitting: Nurse Practitioner

## 2024-07-02 ENCOUNTER — Telehealth: Payer: Self-pay

## 2024-07-02 NOTE — Telephone Encounter (Signed)
 Copied from CRM #[id number]. Topic: General - Other >> Jul 01, 2024  4:48 PM Deleta S wrote: Reason for CRM: patient had missed call from office please contact patient if need. He did not listen to voice message

## 2024-07-13 ENCOUNTER — Ambulatory Visit: Admitting: Nurse Practitioner

## 2024-07-13 VITALS — BP 133/97 | HR 89 | Temp 98.2°F | Ht 74.0 in | Wt 301.4 lb

## 2024-07-13 DIAGNOSIS — E785 Hyperlipidemia, unspecified: Secondary | ICD-10-CM

## 2024-07-13 DIAGNOSIS — Z7985 Long-term (current) use of injectable non-insulin antidiabetic drugs: Secondary | ICD-10-CM

## 2024-07-13 DIAGNOSIS — E119 Type 2 diabetes mellitus without complications: Secondary | ICD-10-CM | POA: Diagnosis not present

## 2024-07-13 DIAGNOSIS — I10 Essential (primary) hypertension: Secondary | ICD-10-CM | POA: Diagnosis not present

## 2024-07-13 DIAGNOSIS — Z7984 Long term (current) use of oral hypoglycemic drugs: Secondary | ICD-10-CM | POA: Diagnosis not present

## 2024-07-13 MED ORDER — AMLODIPINE BESYLATE 10 MG PO TABS
10.0000 mg | ORAL_TABLET | Freq: Every day | ORAL | 11 refills | Status: AC
Start: 2024-07-13 — End: ?

## 2024-07-13 MED ORDER — OZEMPIC (0.25 OR 0.5 MG/DOSE) 2 MG/3ML ~~LOC~~ SOPN: 0.2500 mg | PEN_INJECTOR | SUBCUTANEOUS | 5 refills | Status: DC

## 2024-07-13 MED ORDER — ATORVASTATIN CALCIUM 40 MG PO TABS: 40.0000 mg | ORAL_TABLET | Freq: Every day | ORAL | 11 refills | Status: AC

## 2024-07-13 MED ORDER — METFORMIN HCL ER 500 MG PO TB24: 500.0000 mg | ORAL_TABLET | Freq: Two times a day (BID) | ORAL | 11 refills | Status: DC

## 2024-07-13 MED ORDER — HYDROCHLOROTHIAZIDE 25 MG PO TABS
25.0000 mg | ORAL_TABLET | Freq: Every day | ORAL | 11 refills | Status: AC
Start: 2024-07-13 — End: ?

## 2024-07-13 MED ORDER — EMPAGLIFLOZIN 10 MG PO TABS: 10.0000 mg | ORAL_TABLET | Freq: Every day | ORAL | 11 refills | Status: DC

## 2024-07-13 MED ORDER — LOSARTAN POTASSIUM 50 MG PO TABS: 50.0000 mg | ORAL_TABLET | Freq: Every day | ORAL | 11 refills | Status: DC

## 2024-07-13 NOTE — Progress Notes (Signed)
 Leron Glance, NP-C Phone: [phone]  Eric Stevens is a 53 y.o. male who presents today for follow up.  Discussed the use of AI scribe software for clinical note transcription with the patient, who gave verbal consent to proceed.  History of Present Illness   Eric Stevens is a 53 year old male with hypertension and diabetes who presents for a three-month follow-up.  He has been experiencing issues with obtaining his medication, specifically amlodipine , due to a pharmacy error that indicated a 90-day supply when only a 30-day supply was provided. As a result, he has been without amlodipine  for about two to three weeks. He is not currently taking diltiazem , as it was discontinued previously, and he is unsure of the exact timeline of medication changes. He monitors his blood pressure at home but is uncertain about the accuracy due to using a small wrist cuff. A recent blood pressure reading was 130/90 mmHg at a podiatrist visit two weeks ago. No chest pain, shortness of breath, dizziness, or swelling.  For his diabetes, he is taking Jardiance , metformin , and Ozempic . He takes 0.25 mg of Ozempic  as higher doses cause nausea. He previously tried Mounjaro , which also caused significant gastrointestinal side effects. He takes two metformin  tablets daily, and reports that taking more causes gastrointestinal distress. He does not regularly check his blood sugar but recalls a recent reading was not high after eating. No low blood sugar episodes, but he reports frequent urination.  He takes atorvastatin  (Lipitor) for cholesterol management. He reports eating a lot of fruit and some vegetables, with more fruit than vegetables, and notes that his job involves physical activity, but he does not engage in additional exercise due to fatigue from work.      Social History   Tobacco Use  Smoking Status Former   Types: Cigarettes  Smokeless Tobacco Never    Current Outpatient Medications on File Prior  to Visit  Medication Sig Dispense Refill   Accu-Chek Softclix Lancets lancets Use as instructed 100 each 12   Glucose Blood (BLOOD GLUCOSE TEST STRIPS) STRP 1 each by In Vitro route daily. May substitute to any manufacturer covered by patient's insurance. 100 strip 0   No current facility-administered medications on file prior to visit.     ROS see history of present illness  Objective  Physical Exam Vitals:   07/13/24 1538 07/13/24 1605  BP: (!) 132/100 (!) 133/97  Pulse: 89   Temp: 98.2 F (36.8 C)   SpO2: 98%     BP Readings from Last 3 Encounters:  07/13/24 (!) 133/97  04/14/24 120/80  03/29/24 132/84   Wt Readings from Last 3 Encounters:  07/13/24 (!) 301 lb 6.4 oz (136.7 kg)  03/29/24 (!) 304 lb (137.9 kg)  12/17/23 (!) 304 lb 6 oz (138.1 kg)    Physical Exam Constitutional:      General: He is not in acute distress.    Appearance: Normal appearance.  HENT:     Head: Normocephalic.  Cardiovascular:     Rate and Rhythm: Normal rate and regular rhythm.     Heart sounds: Normal heart sounds.  Pulmonary:     Effort: Pulmonary effort is normal.     Breath sounds: Normal breath sounds.  Skin:    General: Skin is warm and dry.  Neurological:     General: No focal deficit present.     Mental Status: He is alert.  Psychiatric:        Mood and Affect: Mood  normal.        Behavior: Behavior normal.      Assessment/Plan: Please see individual problem list.  Primary hypertension Assessment & Plan: Blood pressure is suboptimally managed due to medication non-adherence, currently at 132/100 mmHg, which is above target. He has not taken amlodipine  for 2-3 weeks. Refilled amlodipine , losartan , and hydrochlorothiazide . Updated medication list and rechecked blood pressure during the visit. Continue Losartan  50 mg daily, hydrochlorothiazide  25 mg daily and Amlodipine  10 mg daily. Counseled on importance of medication adherence. We will continue to monitor.    Orders: -     Losartan  Potassium; Take 1 tablet (50 mg total) by mouth daily.  Dispense: 30 tablet; Refill: 11 -     hydroCHLOROthiazide ; Take 1 tablet (25 mg total) by mouth daily.  Dispense: 30 tablet; Refill: 11 -     amLODIPine  Besylate; Take 1 tablet (10 mg total) by mouth daily.  Dispense: 30 tablet; Refill: 11 -     Comprehensive metabolic panel with GFR  Type 2 diabetes mellitus without complication, without long-term current use of insulin (HCC) Assessment & Plan: Management is complicated by medication side effects and non-adherence. He experiences gastrointestinal side effects with Ozempic  and metformin . A1c needs assessment. Previously tried Mounjaro  with significant side effects. Ordered A1c test. Refilled Jardiance , metformin , and Ozempic . Continue Metformin  ER 500 mg twice daily, Jardiance  10 mg daily and Ozempic  0.25 mg weekly. Counseled on importance of medication adherence. Encouraged dietary modifications and increased physical activity.  Orders: -     metFORMIN  HCl ER; Take 1 tablet (500 mg total) by mouth 2 (two) times daily with a meal.  Dispense: 60 tablet; Refill: 11 -     Empagliflozin ; Take 1 tablet (10 mg total) by mouth daily.  Dispense: 30 tablet; Refill: 11 -     Ozempic  (0.25 or 0.5 MG/DOSE); Inject 0.25 mg into the skin once a week.  Dispense: 3 mL; Refill: 5 -     Hemoglobin A1c  Hyperlipidemia, unspecified hyperlipidemia type Assessment & Plan: Managed with atorvastatin , but lifestyle factors need addressing to improve lipid levels. Refilled atorvastatin . Encouraged dietary modifications and increased physical activity. Continue atorvastatin  40 mg daily.   Orders: -     Atorvastatin  Calcium ; Take 1 tablet (40 mg total) by mouth daily.  Dispense: 30 tablet; Refill: 11 -     Lipid panel      Return in about 3 months (around 10/13/2024) for Follow up.   Leron Glance, NP-C [facility]

## 2024-07-14 LAB — HEMOGLOBIN A1C: Hgb A1c MFr Bld: 7.3 % — ABNORMAL HIGH (ref 4.6–6.5)

## 2024-07-14 LAB — COMPREHENSIVE METABOLIC PANEL WITH GFR
ALT: 41 U/L (ref 0–53)
AST: 25 U/L (ref 0–37)
Albumin: 4.6 g/dL (ref 3.5–5.2)
Alkaline Phosphatase: 64 U/L (ref 39–117)
BUN: 19 mg/dL (ref 6–23)
CO2: 25 meq/L (ref 19–32)
Calcium: 10.3 mg/dL (ref 8.4–10.5)
Chloride: 103 meq/L (ref 96–112)
Creatinine, Ser: 1.12 mg/dL (ref 0.40–1.50)
GFR: 75.94 mL/min (ref >60.00)
Glucose, Bld: 116 mg/dL — ABNORMAL HIGH (ref 70–99)
Potassium: 4 meq/L (ref 3.5–5.1)
Sodium: 140 meq/L (ref 135–145)
Total Bilirubin: 0.4 mg/dL (ref 0.2–1.2)
Total Protein: 7.7 g/dL (ref 6.0–8.3)

## 2024-07-14 LAB — LIPID PANEL
Cholesterol: 146 mg/dL (ref 0–200)
HDL: 42.4 mg/dL (ref >39.00)
LDL Cholesterol: 74 mg/dL (ref 0–99)
NonHDL: 103.78
Total CHOL/HDL Ratio: 3
Triglycerides: 151 mg/dL — ABNORMAL HIGH (ref 0.0–149.0)
VLDL: 30.2 mg/dL (ref 0.0–40.0)

## 2024-07-15 ENCOUNTER — Ambulatory Visit: Payer: Self-pay | Admitting: Nurse Practitioner

## 2024-07-22 ENCOUNTER — Encounter: Payer: Self-pay | Admitting: Nurse Practitioner

## 2024-07-22 NOTE — Assessment & Plan Note (Signed)
 Blood pressure is suboptimally managed due to medication non-adherence, currently at 132/100 mmHg, which is above target. He has not taken amlodipine  for 2-3 weeks. Refilled amlodipine , losartan , and hydrochlorothiazide . Updated medication list and rechecked blood pressure during the visit. Continue Losartan  50 mg daily, hydrochlorothiazide  25 mg daily and Amlodipine  10 mg daily. Counseled on importance of medication adherence. We will continue to monitor.

## 2024-07-22 NOTE — Assessment & Plan Note (Signed)
 Management is complicated by medication side effects and non-adherence. He experiences gastrointestinal side effects with Ozempic  and metformin . A1c needs assessment. Previously tried Mounjaro  with significant side effects. Ordered A1c test. Refilled Jardiance , metformin , and Ozempic . Continue Metformin  ER 500 mg twice daily, Jardiance  10 mg daily and Ozempic  0.25 mg weekly. Counseled on importance of medication adherence. Encouraged dietary modifications and increased physical activity.

## 2024-07-22 NOTE — Assessment & Plan Note (Signed)
 Managed with atorvastatin , but lifestyle factors need addressing to improve lipid levels. Refilled atorvastatin . Encouraged dietary modifications and increased physical activity. Continue atorvastatin  40 mg daily.

## 2024-07-27 ENCOUNTER — Other Ambulatory Visit: Payer: Self-pay | Admitting: Nurse Practitioner

## 2024-07-27 DIAGNOSIS — E119 Type 2 diabetes mellitus without complications: Secondary | ICD-10-CM

## 2024-08-03 MED ORDER — RYBELSUS 7 MG PO TABS: 7.0000 mg | ORAL_TABLET | Freq: Every day | ORAL | 5 refills | Status: AC

## 2024-09-03 ENCOUNTER — Other Ambulatory Visit: Payer: Self-pay

## 2024-09-03 ENCOUNTER — Emergency Department
Admission: EM | Admit: 2024-09-03 | Discharge: 2024-09-04 | Disposition: A | Payer: Self-pay | Attending: Emergency Medicine | Admitting: Emergency Medicine

## 2024-09-03 DIAGNOSIS — M542 Cervicalgia: Secondary | ICD-10-CM | POA: Insufficient documentation

## 2024-09-03 DIAGNOSIS — R519 Headache, unspecified: Secondary | ICD-10-CM | POA: Insufficient documentation

## 2024-09-03 DIAGNOSIS — I10 Essential (primary) hypertension: Secondary | ICD-10-CM | POA: Insufficient documentation

## 2024-09-03 DIAGNOSIS — E119 Type 2 diabetes mellitus without complications: Secondary | ICD-10-CM | POA: Insufficient documentation

## 2024-09-03 MED ORDER — SODIUM CHLORIDE 0.9 % IV BOLUS
500.0000 mL | Freq: Once | INTRAVENOUS | Status: AC
  Administered 2024-09-04: 500 mL via INTRAVENOUS

## 2024-09-03 MED ORDER — KETOROLAC TROMETHAMINE 30 MG/ML IJ SOLN
15.0000 mg | Freq: Once | INTRAMUSCULAR | Status: AC
  Administered 2024-09-04: 15 mg via INTRAVENOUS
  Filled 2024-09-03: qty 1

## 2024-09-03 NOTE — ED Triage Notes (Signed)
 Patient brought in by wife for headache and neck pain x2 days. No hx of same, no vision changes, no light sensitivity. Patient states pain worsens and lessens but never goes away completely. AxOx4, ambulatory with steady gait.

## 2024-09-03 NOTE — ED Provider Notes (Signed)
 [facility] Provider Note    Event Date/Time   First MD Initiated Contact with Patient 09/03/24 2349     (approximate)  History   Chief Complaint: Headache  HPI  Eric Stevens is a 53 y.o. male with a past medical history of diabetes, hypertension, presents to the emergency department for headache and neck pain.  According to the patient for the past 3 days he has had moderate pain in his neck/back of his head as well as intermittent headache.  Patient states no history of neck pain or headache previously.  States the pain comes and goes but never goes away described as moderate currently.  No weakness or numbness.  Afebrile.  Patient states his blood sugars have been well-controlled.  Physical Exam   Triage Vital Signs: ED Triage Vitals  Encounter Vitals Group     BP 09/03/24 2133 (!) 141/94     Girls Systolic BP Percentile --      Girls Diastolic BP Percentile --      Boys Systolic BP Percentile --      Boys Diastolic BP Percentile --      Pulse Rate 09/03/24 2133 96     Resp 09/03/24 2133 18     Temp 09/03/24 2133 98.6 F (37 C)     Temp Source 09/03/24 2133 Oral     SpO2 09/03/24 2133 96 %     Weight 09/03/24 2132 290 lb (131.5 kg)     Height 09/03/24 2132 6' 3 (1.905 m)     Head Circumference --      Peak Flow --      Pain Score 09/03/24 2132 4     Pain Loc --      Pain Education --      Exclude from Growth Chart --     Most recent vital signs: Vitals:   09/03/24 2133  BP: (!) 141/94  Pulse: 96  Resp: 18  Temp: 98.6 F (37 C)  SpO2: 96%    General: Awake, no distress.  CV:  Good peripheral perfusion.  Regular rate and rhythm  Resp:  Normal effort.  Equal breath sounds bilaterally.  Abd:  No distention.  Soft, nontender.  No rebound or guarding. Other:  No midline or paraspinal tenderness of the cervical spine.  Good range of motion of the cervical spine without pain.   ED Results / Procedures / Treatments   RADIOLOGY  I  have reviewed interpret the CT images.  I do not appreciate any large bleed or significant abnormality on my evaluation. Radiologist read the CTA as negative   MEDICATIONS ORDERED IN ED: Medications  ketorolac  (TORADOL ) 30 MG/ML injection 15 mg (has no administration in time range)  sodium chloride  0.9 % bolus 500 mL (has no administration in time range)     IMPRESSION / MDM / ASSESSMENT AND PLAN / ED COURSE  I reviewed the triage vital signs and the nursing notes.  Patient's presentation is most consistent with acute presentation with potential threat to life or bodily function.  Patient presents emergency department for headache and neck pain ongoing for the last 3 days.  Patient states the pain is coming and going moderate currently but does not ever go all the way away.  No history of headaches or neck pain previously.  Cannot recall any inciting event.  Patient's vital signs are reassuring.  Will check basic lab work.  Given no history of headaches we will obtain a CTA of  the head and the neck as a precaution.  Patient and wife agreeable to plan of care.  We will treat with fluids and Toradol  while awaiting CT results.  Lab work reassuring with a normal CBC, reassuring chemistry.  CTA of the head and neck has been read as negative.  Patient is feeling better after medication.  We will discharge with Fioricet to be used if needed for headache otherwise patient will follow-up with his doctor.  FINAL CLINICAL IMPRESSION(S) / ED DIAGNOSES   Headache   Note:  This document was prepared using Dragon voice recognition software and may include unintentional dictation errors.   Dorothyann Drivers, MD 09/04/24 [phone]

## 2024-09-04 ENCOUNTER — Emergency Department: Payer: Self-pay

## 2024-09-04 LAB — BASIC METABOLIC PANEL WITH GFR
Anion gap: 15 (ref 5–15)
BUN: 18 mg/dL (ref 6–20)
CO2: 21 mmol/L — ABNORMAL LOW (ref 22–32)
Calcium: 9.2 mg/dL (ref 8.9–10.3)
Chloride: 103 mmol/L (ref 98–111)
Creatinine, Ser: 1.1 mg/dL (ref 0.61–1.24)
GFR, Estimated: >60 mL/min (ref >60)
Glucose, Bld: 136 mg/dL — ABNORMAL HIGH (ref 70–99)
Potassium: 3.6 mmol/L (ref 3.5–5.1)
Sodium: 139 mmol/L (ref 135–145)

## 2024-09-04 LAB — CBC
HCT: 45.8 % (ref 39.0–52.0)
Hemoglobin: 15.7 g/dL (ref 13.0–17.0)
MCH: 30.9 pg (ref 26.0–34.0)
MCHC: 34.3 g/dL (ref 30.0–36.0)
MCV: 90.2 fL (ref 80.0–100.0)
Platelets: 274 K/uL (ref 150–400)
RBC: 5.08 MIL/uL (ref 4.22–5.81)
RDW: 12.7 % (ref 11.5–15.5)
WBC: 9.1 K/uL (ref 4.0–10.5)
nRBC: 0 % (ref 0.0–0.2)

## 2024-09-04 MED ORDER — BUTALBITAL-APAP-CAFFEINE 50-325-40 MG PO TABS: 1.0000 | ORAL_TABLET | Freq: Four times a day (QID) | ORAL | 0 refills | Status: DC | PRN

## 2024-09-04 MED ORDER — IOHEXOL 350 MG/ML SOLN
75.0000 mL | Freq: Once | INTRAVENOUS | Status: AC | PRN
  Administered 2024-09-04: 75 mL via INTRAVENOUS

## 2024-09-04 NOTE — ED Notes (Signed)
 Patient transported to CT

## 2024-09-28 ENCOUNTER — Ambulatory Visit: Admitting: Nurse Practitioner

## 2024-11-16 ENCOUNTER — Ambulatory Visit: Payer: Self-pay | Admitting: Nurse Practitioner

## 2024-11-16 ENCOUNTER — Encounter: Payer: Self-pay | Admitting: Nurse Practitioner

## 2024-11-16 VITALS — BP 153/89 | HR 95 | Temp 98.4°F | Ht 75.0 in | Wt 308.2 lb

## 2024-11-16 DIAGNOSIS — E119 Type 2 diabetes mellitus without complications: Secondary | ICD-10-CM | POA: Diagnosis not present

## 2024-11-16 DIAGNOSIS — E785 Hyperlipidemia, unspecified: Secondary | ICD-10-CM | POA: Diagnosis not present

## 2024-11-16 DIAGNOSIS — I10 Essential (primary) hypertension: Secondary | ICD-10-CM | POA: Diagnosis not present

## 2024-11-16 DIAGNOSIS — E669 Obesity, unspecified: Secondary | ICD-10-CM | POA: Diagnosis not present

## 2024-11-16 DIAGNOSIS — Z7984 Long term (current) use of oral hypoglycemic drugs: Secondary | ICD-10-CM

## 2024-11-16 LAB — POCT GLYCOSYLATED HEMOGLOBIN (HGB A1C)
HbA1c POC (<> result, manual entry): 7.1 %
HbA1c, POC (controlled diabetic range): 7.1 % — AB (ref 0.0–7.0)
HbA1c, POC (prediabetic range): 7.1 % — AB (ref 5.7–6.4)
Hemoglobin A1C: 7.1 % — AB (ref 4.0–5.6)

## 2024-11-16 MED ORDER — EMPAGLIFLOZIN 25 MG PO TABS: 25.0000 mg | ORAL_TABLET | Freq: Every day | ORAL | 11 refills | Status: AC

## 2024-11-16 MED ORDER — METFORMIN HCL ER 500 MG PO TB24: 500.0000 mg | ORAL_TABLET | Freq: Every day | ORAL | Status: AC

## 2024-11-16 MED ORDER — LOSARTAN POTASSIUM 100 MG PO TABS: 100.0000 mg | ORAL_TABLET | Freq: Every day | ORAL | 11 refills | Status: AC

## 2024-11-16 NOTE — Progress Notes (Signed)
 " Leron Glance, NP-C Phone: [phone]  Eric Stevens is a 53 y.o. male who presents today for follow up.   Discussed the use of AI scribe software for clinical note transcription with the patient, who gave verbal consent to proceed.  History of Present Illness   Eric Stevens is a 53 year old male with type 2 diabetes and hypertension who presents for management of his diabetes and blood pressure.  He experiences nasal congestion at night, particularly in one nostril. He uses nasal spray occasionally, which provides relief, but he tries to avoid frequent use. He primarily uses saline spray and occasionally Flonase  (fluticasone ).  He has type 2 diabetes and is currently on metformin , Rybelsus , and Jardiance . 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 He reports having been on Rybelsus  for three months, starting with 3 mg and now taking 7 mg. He also takes Jardiance  at a 10 mg dose. He does not regularly check his blood sugar at home. His last A1c was 7.3, and he aims to keep it below 7.  He has hypertension and is on amlodipine , hydrochlorothiazide , and losartan . He does not check his blood pressure at home. No chest pain, shortness of breath, dizziness, or swelling.  He takes atorvastatin  for cholesterol management. He mentions dietary habits, including reducing bread intake and not consuming much soda, but occasionally drinks sweet tea.      Tobacco Use History[1]  Medications Ordered Prior to Encounter[2]   ROS see history of present illness  Objective  Physical Exam Vitals:   11/16/24 0910 11/16/24 0925  BP: (!) 134/95 (!) 153/89  Pulse: 95   Temp: 98.4 F (36.9 C)   SpO2: 96%     BP Readings from Last 3 Encounters:  11/16/24 (!) 153/89  09/03/24 (!) 141/94  07/13/24 (!) 133/97   Wt Readings from Last 3 Encounters:  11/16/24 (!) 308 lb 3.2 oz (139.8 kg)  09/03/24 290 lb (131.5 kg)   07/13/24 (!) 301 lb 6.4 oz (136.7 kg)    Physical Exam Constitutional:      General: He is not in acute distress.    Appearance: Normal appearance.  HENT:     Head: Normocephalic.  Cardiovascular:     Rate and Rhythm: Normal rate and regular rhythm.     Heart sounds: Normal heart sounds.  Pulmonary:     Effort: Pulmonary effort is normal.     Breath sounds: Normal breath sounds.  Skin:    General: Skin is warm and dry.  Neurological:     General: No focal deficit present.     Mental Status: He is alert.  Psychiatric:        Mood and Affect: Mood normal.        Behavior: Behavior normal.      Assessment/Plan: Please see individual problem list.  Type 2 diabetes mellitus without complication, without long-term current use of insulin (HCC) Assessment & Plan: Management is complicated by medication side effects and non-adherence. A1c improved today to 7.1. Metformin  causes diarrhea, so it was decreased to once daily. Rybelsus  is tolerated better than Ozempic  and will continue at 7 mg daily. Jardiance  increased to 25 mg daily. Monitor A1c and adjust medications as needed. Emphasized dietary modifications, particularly reducing bread intake.   Orders: -     POCT glycosylated hemoglobin (Hb A1C) -     metFORMIN  HCl ER; Take 1 tablet (500 mg  total) by mouth daily with breakfast. -     Empagliflozin ; Take 1 tablet (25 mg total) by mouth daily.  Dispense: 30 tablet; Refill: 11  Primary hypertension Assessment & Plan: Blood pressure elevated x 2 readings today in office. Managed with Losartan  50 mg daily, hydrochlorothiazide  25 mg daily and Norvasc  10 mg daily. Increase Losartan  to 100 mg daily. Continue hydrochlorothiazide  and norvasc . Counseled on importance of blood pressure control and medication adherence. Counseled on healthy diet, decreasing sodium intake and regular physical activity. Encourage home monitoring.   Orders: -     Losartan  Potassium; Take 1 tablet (100 mg total)  by mouth daily.  Dispense: 30 tablet; Refill: 11  Hyperlipidemia, unspecified hyperlipidemia type Assessment & Plan: Managed with atorvastatin , but lifestyle factors need addressing to improve lipid levels. Continue atorvastatin . Encouraged dietary modifications and increased physical activity.    Obesity (BMI 30-39.9) Assessment & Plan: Recent weight gain of 18 pounds. Dietary habits, particularly bread consumption, are contributing factors. Emphasized dietary modifications, particularly reducing bread intake, and encouraged regular exercise.        Return in about 3 months (around 02/14/2025) for Follow up.   Leron Glance, NP-C [facility] [place]     [1]  Social History Tobacco Use  Smoking Status Former   Types: Cigarettes  Smokeless Tobacco Never  [2]  Current Outpatient Medications on File Prior to Visit  Medication Sig Dispense Refill   Accu-Chek Softclix Lancets lancets Use as instructed 100 each 12   amLODipine  (NORVASC ) 10 MG tablet Take 1 tablet (10 mg total) by mouth daily. 30 tablet 11   atorvastatin  (LIPITOR) 40 MG tablet Take 1 tablet (40 mg total) by mouth daily. 30 tablet 11   Glucose Blood (BLOOD GLUCOSE TEST STRIPS) STRP 1 each by In Vitro route daily. May substitute to any manufacturer covered by patient's insurance. 100 strip 0   hydrochlorothiazide  (HYDRODIURIL ) 25 MG tablet Take 1 tablet (25 mg total) by mouth daily. 30 tablet 11   Semaglutide  (RYBELSUS ) 7 MG TABS Take 1 tablet (7 mg total) by mouth daily. Start after completing one month of 3 mg. 30 tablet 5   No current facility-administered medications on file prior to visit.   "

## 2024-11-16 NOTE — Assessment & Plan Note (Signed)
 Recent weight gain of 18 pounds. Dietary habits, particularly bread consumption, are contributing factors. Emphasized dietary modifications, particularly reducing bread intake, and encouraged regular exercise.

## 2024-11-16 NOTE — Assessment & Plan Note (Signed)
 Management is complicated by medication side effects and non-adherence. A1c improved today to 7.1. Metformin  causes diarrhea, so it was decreased to once daily. Rybelsus  is tolerated better than Ozempic  and will continue at 7 mg daily. Jardiance  increased to 25 mg daily. Monitor A1c and adjust medications as needed. Emphasized dietary modifications, particularly reducing bread intake.

## 2024-11-16 NOTE — Assessment & Plan Note (Signed)
 Blood pressure elevated x 2 readings today in office. Managed with Losartan  50 mg daily, hydrochlorothiazide  25 mg daily and Norvasc  10 mg daily. Increase Losartan  to 100 mg daily. Continue hydrochlorothiazide  and norvasc . Counseled on importance of blood pressure control and medication adherence. Counseled on healthy diet, decreasing sodium intake and regular physical activity. Encourage home monitoring.

## 2024-11-16 NOTE — Assessment & Plan Note (Signed)
 Managed with atorvastatin , but lifestyle factors need addressing to improve lipid levels. Continue atorvastatin . Encouraged dietary modifications and increased physical activity.

## 2024-11-23 ENCOUNTER — Ambulatory Visit: Admitting: Family Medicine

## 2025-02-15 ENCOUNTER — Ambulatory Visit: Admitting: Nurse Practitioner
